# Patient Record
Sex: Female | Born: 1975 | Race: Black or African American | Hispanic: No | Marital: Married | Smoking: Never smoker
Health system: Southern US, Community
[De-identification: ages and names within clinical notes are randomized; demographics above are authoritative.]

## PROBLEM LIST (undated history)

## (undated) DIAGNOSIS — R7989 Other specified abnormal findings of blood chemistry: Secondary | ICD-10-CM

## (undated) DIAGNOSIS — T7840XA Allergy, unspecified, initial encounter: Secondary | ICD-10-CM

## (undated) DIAGNOSIS — M1710 Unilateral primary osteoarthritis, unspecified knee: Secondary | ICD-10-CM

## (undated) HISTORY — DX: Unilateral primary osteoarthritis, unspecified knee: M17.10

## (undated) HISTORY — DX: Allergy, unspecified, initial encounter: T78.40XA

## (undated) HISTORY — DX: Other specified abnormal findings of blood chemistry: R79.89

## (undated) HISTORY — PX: ABLATION: SHX5711

---

## 1999-02-08 ENCOUNTER — Other Ambulatory Visit: Admission: RE | Admit: 1999-02-08 | Discharge: 1999-02-08 | Payer: Self-pay | Admitting: Obstetrics and Gynecology

## 2000-01-10 ENCOUNTER — Emergency Department (HOSPITAL_COMMUNITY): Admission: EM | Admit: 2000-01-10 | Discharge: 2000-01-10 | Payer: Self-pay | Admitting: Emergency Medicine

## 2000-01-10 ENCOUNTER — Encounter: Payer: Self-pay | Admitting: Emergency Medicine

## 2000-02-27 ENCOUNTER — Other Ambulatory Visit: Admission: RE | Admit: 2000-02-27 | Discharge: 2000-02-27 | Payer: Self-pay | Admitting: Obstetrics and Gynecology

## 2001-09-22 ENCOUNTER — Other Ambulatory Visit: Admission: RE | Admit: 2001-09-22 | Discharge: 2001-09-22 | Payer: Self-pay | Admitting: Obstetrics and Gynecology

## 2003-03-31 ENCOUNTER — Other Ambulatory Visit: Admission: RE | Admit: 2003-03-31 | Discharge: 2003-03-31 | Payer: Self-pay | Admitting: Obstetrics and Gynecology

## 2004-01-01 ENCOUNTER — Other Ambulatory Visit: Admission: RE | Admit: 2004-01-01 | Discharge: 2004-01-01 | Payer: Self-pay | Admitting: Obstetrics and Gynecology

## 2004-06-24 ENCOUNTER — Emergency Department (HOSPITAL_COMMUNITY): Admission: EM | Admit: 2004-06-24 | Discharge: 2004-06-24 | Payer: Self-pay | Admitting: Emergency Medicine

## 2004-07-03 ENCOUNTER — Inpatient Hospital Stay (HOSPITAL_COMMUNITY): Admission: AD | Admit: 2004-07-03 | Discharge: 2004-07-03 | Payer: Self-pay | Admitting: Obstetrics and Gynecology

## 2004-07-11 ENCOUNTER — Inpatient Hospital Stay (HOSPITAL_COMMUNITY): Admission: AD | Admit: 2004-07-11 | Discharge: 2004-07-15 | Payer: Self-pay | Admitting: Obstetrics and Gynecology

## 2004-07-12 ENCOUNTER — Encounter (INDEPENDENT_AMBULATORY_CARE_PROVIDER_SITE_OTHER): Payer: Self-pay | Admitting: Specialist

## 2005-03-18 ENCOUNTER — Encounter: Admission: RE | Admit: 2005-03-18 | Discharge: 2005-04-25 | Payer: Self-pay | Admitting: Internal Medicine

## 2006-12-15 ENCOUNTER — Inpatient Hospital Stay (HOSPITAL_COMMUNITY): Admission: RE | Admit: 2006-12-15 | Discharge: 2006-12-15 | Payer: Self-pay | Admitting: Obstetrics and Gynecology

## 2007-02-11 ENCOUNTER — Inpatient Hospital Stay (HOSPITAL_COMMUNITY): Admission: AD | Admit: 2007-02-11 | Discharge: 2007-02-14 | Payer: Self-pay | Admitting: Obstetrics and Gynecology

## 2007-03-08 ENCOUNTER — Emergency Department (HOSPITAL_COMMUNITY): Admission: EM | Admit: 2007-03-08 | Discharge: 2007-03-08 | Payer: Self-pay | Admitting: Family Medicine

## 2008-09-04 ENCOUNTER — Ambulatory Visit (HOSPITAL_COMMUNITY): Admission: RE | Admit: 2008-09-04 | Discharge: 2008-09-04 | Payer: Self-pay | Admitting: Obstetrics and Gynecology

## 2008-11-10 ENCOUNTER — Ambulatory Visit (HOSPITAL_COMMUNITY): Admission: RE | Admit: 2008-11-10 | Discharge: 2008-11-10 | Payer: Self-pay | Admitting: Obstetrics and Gynecology

## 2008-11-10 ENCOUNTER — Encounter (INDEPENDENT_AMBULATORY_CARE_PROVIDER_SITE_OTHER): Payer: Self-pay | Admitting: Obstetrics and Gynecology

## 2011-04-15 NOTE — Op Note (Signed)
[name], [name]                 ACCOUNT NO.:  [id number]   MEDICAL RECORD NO.:  [id number]          PATIENT TYPE:  AMB   LOCATION:  SDC                           [facility]   PHYSICIAN:  Maxie Better, M.D.DATE OF BIRTH:  August 04, 1976   DATE OF PROCEDURE:  11/10/2008  DATE OF DISCHARGE:                               OPERATIVE REPORT   PREOPERATIVE DIAGNOSIS:  Menorrhagia.   PROCEDURE:  NovaSure ablation , diagnostic hysteroscope and dilation and  curettage.   POSTOPERATIVE DIAGNOSIS:  Menorrhagia.   ANESTHESIA:  MAC paracervical block.   SURGEON:  Maxie Better, MD   PROCEDURE:  Under adequate monitored anesthesia, the patient was placed  in a dorsal lithotomy position.  She was sterilely prepped and draped in  usual fashion.  Bladder was catheterized with small amount of urine.  Examination under anesthesia revealed a slightly anteverted uterus.  No  adnexal masses could be appreciated.  Bivalve speculum was placed into  the vagina.  A 10 mL of 1% Nesacaine was injected at 3 and 9 o'clock  position.  The anterior lip of the cervix was grasped with a single-  tooth tenaculum.  The uterus sounded to 10 cm and the endocervical canal  sounded to 5 cm.  The diagnostic hysteroscope was then introduced, both  tubal ostia were seen.  Small amount of healthy tissue was noted, but  otherwise unremarkable.  Hysteroscope was removed.  The NovaSure  apparatus was inserted.  The testing did not have the adequate  evaluation in the cavity.  The cavity length was decreased at 4.5 and  after adjusted to the angle of the uterus, the apparatus was ready for  the ablation.  The ablation was then performed with a cavity width of  3.5, power of 87, and a total time of 1 minute and 50 seconds.  The  procedure was then terminated.  The NovaSure apparatus removed.  The  hysteroscope inserted.  Good ablation noted.  The procedure was then  terminated by removing all instruments.   SPECIMEN:  Endometrial curetting.   ESTIMATED BLOOD LOSS:  Minimal.   COMPLICATIONS:  None.   FLUID DEFICIT:  Minimal.   The patient tolerated the procedure well and was transferred to recovery  in stable condition.     Maxie Better, M.D.  Electronically Signed    [place]/MEDQ  D:  11/10/2008  T:  11/11/2008  Job:  [id number]

## 2011-04-18 NOTE — Discharge Summary (Signed)
NAMECHELSIA, SERRES                             ACCOUNT NO.:  [id number]   MEDICAL RECORD NO.:  [id number]                   PATIENT TYPE:  INP   LOCATION:  9132                                 [facility]   PHYSICIAN:  Malachi Pro. Ambrose Mantle, M.D.              DATE OF BIRTH:  June 09, 1976   DATE OF ADMISSION:  07/11/2004  DATE OF DISCHARGE:                                 DISCHARGE SUMMARY   ADDENDUM TO DISCHARGE SUMMARY:   DISCHARGE MEDICATIONS:  1. Percocet 5/325 24 tablets 1 q.4-6h. p.r.n. pain.  2. Augmentin 500 mg 6 tablets 1 q.8h. x2 days.                                               Malachi Pro. Ambrose Mantle, M.D.    TFH/MEDQ  D:  07/15/2004  T:  07/15/2004  Job:  [id number]

## 2011-04-18 NOTE — Op Note (Signed)
Kellie [name], Kellie [name]                             ACCOUNT NO.:  [id number]   MEDICAL RECORD NO.:  [id number]                   PATIENT TYPE:  INP   LOCATION:  9172                                 [facility]   PHYSICIAN:  Malachi Pro. Ambrose Mantle, M.D.              DATE OF BIRTH:  08-Jun-1976   DATE OF PROCEDURE:  07/12/2004  DATE OF DISCHARGE:                                 OPERATIVE REPORT   PREOPERATIVE DIAGNOSES:  1. Intrauterine pregnancy at 40 weeks.  2. Pregnancy-induced high blood pressure.  3. Failure to progress in labor.   POSTOPERATIVE DIAGNOSES:  1. Intrauterine pregnancy at 40 weeks.  2. Pregnancy-induced high blood pressure.  3. Failure to progress in labor.   OPERATION:  Low transverse cervical cesarean section.   OPERATOR:  Malachi Pro. Ambrose Mantle, M.D.   ANESTHESIA:  Epidural.   PROCEDURE:  The patient was brought to the operating room and placed under  additional epidural anesthesia.  Fetal heart tones were confirmed.  The  patient's maternal heart rate had been 115-160.  The urine output was  diminished, about 150 cc over the previous 6 hours.  The abdomen was prepped  with Betadine solution, and draped as a sterile field.  A Foley catheter was  indwelling.  Anesthesia was confirmed.  A transverse incision was made and  carried in layers through the skin, subcutaneous tissue, and fascia.  The  fascia was then separated from the rectus muscle superiorly and inferiorly.  The rectus muscle was already split in the midline.  The peritoneum was  opened vertically.  The lower uterine segment was exposed.  An incision was  made into the lower uterine segment peritoneum, extended laterally, and the  bladder was pushed inferiorly.  An incision was then made into the lower  uterine segment with the knife, but  my finger was used to go into the  amniotic sac.  Clear fluid was obtained.  The incision was enlarged by  pulling superiorly and inferiorly on the uterine incision.  A loop  of cord  dropped out, and then the infant was delivered from the vertex position  without difficulty.  Nose and pharynx was suctioned with the bulb, cord was  clamped, and the infant was given to the neonatologist, Dr. Eric Form, who was  in attendance.  A segment of cord was preserved in case a pH was necessary.  Retained cord blood studies were obtained.  The placenta was removed intact.  The inside of the uterus was palpated and found to be free of any additional  fragments.  Both tubes and ovaries and the uterus appeared normal.  The  uterine incision was closed with 2 running sutures of 0 Vicryl, locking the  first layer, non-locking on the second layer.  Liberal irrigation confirmed  hemostasis.  Neither the parietal nor the visceral peritoneum were  reapproximated.  Hemostasis was found to be adequate, and  the rectus muscle  was reapproximated with interrupted 0 Vicryl.  The fascia was closed with 2  running sutures of 0  Vicryl, subcutaneous with a running 3-0 Vicryl, and the skin was closed with  automatic staples.  The patient seemed to tolerate the procedure well.  Blood loss was about 1000 cc.  Sponge and needle counts were correct, and  the patient was returned to recovery in satisfactory condition.                                               Malachi Pro. Ambrose Mantle, M.D.    TFH/MEDQ  D:  07/12/2004  T:  07/12/2004  Job:  [id number]

## 2011-04-18 NOTE — Discharge Summary (Signed)
Kellie [name], Kellie [name]                             ACCOUNT NO.:  [id number]   MEDICAL RECORD NO.:  [id number]                   PATIENT TYPE:  INP   LOCATION:  9132                                 [facility]   PHYSICIAN:  Malachi Pro. Ambrose Mantle, M.D.              DATE OF BIRTH:  January 30, 1976   DATE OF ADMISSION:  07/11/2004  DATE OF DISCHARGE:  07/15/2004                                 DISCHARGE SUMMARY   A 35 year old, black, married female, para 0, gravida 1, EDC July 11, 2004, by ultrasound, admitted with contractions and elevated blood pressure.  The patient's prenatal laboratories are noted in her history and physical.  After admission to the hospital, the patient was given Cervidil to see if it  would ripen her cervix.  The patient's cervix had remained closed throughout  the latter part of the prenatal course.  By 6:30 a.m. on July 12, 2004,  the cervix was 4 cm dilated, but in spite of optimal doses of Pitocin and  good contractions, the patient never progressed beyond 5-6 cm.  At  approximately 6 p.m., she was taken to the operating room and underwent a  low transverse cervical C-section by Dr. Ambrose Mantle under epidural anesthesia  with delivery of an 8 pound 0 ounce infant with Apgars of 7 at one minute  and 9 at five minutes.  Blood loss was about 1000 mL.  During labor, the  patient's heart rate ranged from 115 to the 160s.  After delivery, W.  Viann Fish, M.D., was consulted.  He advised getting a TSH and if she  continued to have problems with palpitations, then she should come by his  office for 30-day event monitor to make sure she was not having any  arrhythmias.  The patient did have a temperature elevation to 100.4 degrees  soon after delivery and then on evening of the first postoperative day she  had a fever to 100.7 degrees.  She was placed on Unasyn.  She did extremely  clinically.  She passed flatus, had a bowel movement, tolerated regular  diet, voided well  without difficulty and ambulated well without any  problems.  After beginning the Unasyn, she became afebrile and has remained  afebrile for 32 hours prior to discharge.  Staples have been removed and  strips applied.  The TSH was 1.901, which is well within the normal limits.  The initial hemoglobin was 12.5.  Followup hemoglobins were 11, 9, and 10.2.  Platelet count 301,000.  White count 6600.  The comprehensive metabolic  panel was basically normal.  There was one low sodium at 133 and a glucose  of 121.  Albumins were low and alkaline phosphatase was high, consistent  with pregnancy.  Urinalysis showed 11-20 red cells.  Urine culture is  pending.  RPR was nonreactive.   FINAL DIAGNOSIS:  Intrauterine pregnancy at 40 weeks delivered by C-section  with failure to progress in labor and possible chorioamnionitis and  endometritis.   OPERATION:  Low transverse cervical cesarean section.   FINAL CONDITION:  Improved.   INSTRUCTIONS:  Our regular discharge instruction booklet.  The patient is  advised to make an appointment to see [place] in 10-14 days for followup  examination.                                               Malachi Pro. Ambrose Mantle, M.D.    TFH/MEDQ  D:  07/15/2004  T:  07/15/2004  Job:  [id number]

## 2011-04-18 NOTE — Op Note (Signed)
Kellie [name], Kellie [name]                 ACCOUNT NO.:  [id number]   MEDICAL RECORD NO.:  [id number]          PATIENT TYPE:  INP   LOCATION:  9130                          [facility]   PHYSICIAN:  Maxie Better, M.D.DATE OF BIRTH:  October 18, 1976   DATE OF PROCEDURE:  02/11/2007  DATE OF DISCHARGE:                               OPERATIVE REPORT   PREOPERATIVE DIAGNOSES:  1. Previous cesarean section.  2. Term gestation.   PROCEDURE:  1. Repeat cesarean section.  2. Lysis of adhesions.   POSTOPERATIVE DIAGNOSES:  1. Previous cesarean section.  2. Term gestation.  3. Abdominal pelvic adhesions.   ANESTHESIA:  Spinal.   SURGEON:  Maxie Better, M.D.   ASSISTANT:  Marlinda Mike, C.N.M.   PROCEDURE:  After multiple attempts by the anesthesia team, over a  period of approximately an hour, spinal anesthesia was obtained.  The  patient was subsequently placed in the supine position.  She was  sterilely prepped and draped in the usual fashion.  Indwelling Foley  catheter was sterilely placed.  Adequate anesthesia level was noted.  Marcaine 0.25% was then injected along the previous Pfannenstiel skin  incision.  Pfannenstiel skin incision was then made and carried down to  the rectus fascia.  The rectus fascia was opened transversely.  The  rectus fascia was then bluntly and sharply dissected off the rectus  muscle in superior and inferior fashion.  The rectus muscles were  sharply split in the midline.  The parietoperitoneum was at that point  entered.  Careful dissection was then performed, and the  parietoperitoneum was then extended superiorly and inferiorly.  On  entering the abdominal cavity, it was noted that the omentum was  partially adhered to the anterior abdominal wall; this was lysed.  It  was also noted that the right anterior aspect of the serosa of the  uterus was approximately 2 inches in length was also adherent to the  right anterior abdominal wal. This   limited the ability to deliver  without further taken down the adhesions.  Careful dissection was then  performed using cautery and sharp dissection with resultant separation  of the serosal surface of the uterus off the anterior abdominal wall.  Attention was then turned to the lower uterine segment.  A transverse  incision was then attempted to develop the bladder flap.  It was already  noted that the bladder was very adherent to the lower uterine segment.  Concern for damage resulted in no further attempts at sharp dissection.  A curvilinear incision was then made above what appeared to be the  bladder reflection and extended bilaterally.  Artificial rupture of  membranes was performed.  Clear fluid was noted.  The vertex was  floating.  At that point a vacuum was applied to the vertex for  stabilization and delivery of a live female.  This was subsequently  accomplished from the occiput anterior position.  The baby was bulb  suctioned at the abdomen.  The cord was clamped and cut.  The baby was  transferred to the awaiting pediatricians, who assigned Apgars of  8 and  9 at 1 and 5 minutes.  The placenta was spontaneous intact; not sent to  pathology.  Uterine cavity was cleaned of debris.  Uterine incision had  no extension.  Uterine incision was closed with 0  Monocryl running lock  stitch at first layer; second layer  was also imbricated using 0  Monocryl suture.  An additional bleeder  on the right was hemostased  with a figure-of-eight suture.  The bladder area was inspected and was  well away from the incisions.  Normal tubes and ovaries were noted  bilaterally.   The patient was then turned back to the defect made from the separation  of the uterine adhesions to the anterior abdominal wall.  This defect  was then closed with 0 Vicryl running stitch.  Additional bleeders were  cauterized on the uterus in that area.  Inspection of the omentum showed  that there was freely mobile,  and normal tubes and ovaries were noted  bilaterally.  The abdomen was then copiously irrigated and suctioned of  debris.  Intercede  was placed overlying the repaired defect in uterus,  which was related to the adhesions.  The parietoperitoneum was then  closed with 2-0 Vicryl suture.  The rectus fascia was closed with 0  Vicryl x2.  The subcutaneous area was closed with interrupted 2-0 plain  sutures, and the skin approximated using Ethicon staples.   SPECIMEN:  Placenta not sent to pathology.   ESTIMATED BLOOD LOSS:  500 mL.   URINE OUTPUT:  200 mL clear yellow urine.   INTRAOPERATIVE FLUID:  3 liters.   FETAL WEIGHT:  7 pounds.   COMPLICATIONS:  None.   COUNTS:  Sponge and instrument counts x2 were correct.   DISPOSITION:  The patient tolerated the procedure well; was transferred  to the recovery room in stable condition.      Maxie Better, M.D.  Electronically Signed     [place]/MEDQ  D:  02/11/2007  T:  02/11/2007  Job:  [id number]

## 2011-04-18 NOTE — Discharge Summary (Signed)
Kellie [name], Kellie [name]                 ACCOUNT NO.:  [id number]   MEDICAL RECORD NO.:  [id number]          PATIENT TYPE:  INP   LOCATION:  9130                          [facility]   PHYSICIAN:  Maxie Better, M.D.DATE OF BIRTH:  [birth year]   DATE OF ADMISSION:  02/11/2007  DATE OF DISCHARGE:  02/14/2007                               DISCHARGE SUMMARY   ADMISSION DIAGNOSIS:  Term gestation, previous cesarean section.   DISCHARGE DIAGNOSIS:  Term gestation delivered, previous cesarean  section, abdominal pelvic adhesions.   PROCEDURE:  Repeat cesarean section, lysis of adhesions.   HISTORY OF PRESENT ILLNESS:  This is a 35 year old gravida 2, para 1  female at term with a previous cesarean section who presents for  elective repeat C-section.   HOSPITAL COURSE:  The patient was admitted to [facility].  She was  taken to the operating room.  She underwent a repeat cesarean section.  Abdominal pelvic adhesions were encountered at time of her surgery.  Please see the dictated operative report for the specific details.  The  baby was a 7 pounds female, Apgars of 8 and 9.  Normal tubes and ovaries.  The right uterine wall was attached to the anterior wall and that was  lysed.  Her postoperative course was notable for transient elevation of  her blood pressure was PIH labs that were normal.  Her CBC on postop day  #1 showed a hemoglobin of 10.2.  Her preop hemoglobin was 11.3.  Her  hematocrit was 29.8, platelet count of 20,000, white count of 7.8.  By  postop day #3, her blood pressures were 142-155 over 94-99.  The patient  had no PIH warning signs and exam was unremarkable.  She was deemed well  to be discharged home.   DISPOSITION:  Home.  Condition stable.   DISCHARGE MEDICATIONS:  Tylox 1-2 tablets every three to four hours  p.r.n. pain and Motrin 800 mg one p.o. q.8h p.r.n. pain.  Continue  prenatal vitamins one p.o. daily   FOLLOW-UP APPOINTMENTS:  [facility]   six weeks postpartum.  Discharge instructions with the postpartum booklet given.      Maxie Better, M.D.  Electronically Signed     [place]/MEDQ  D:  03/28/2007  T:  03/28/2007  Job:  [id number]

## 2011-04-18 NOTE — H&P (Signed)
Kellie [name], Kellie [name]                             ACCOUNT NO.:  [id number]   MEDICAL RECORD NO.:  [id number]                   PATIENT TYPE:  INP   LOCATION:  9172                                 [facility]   PHYSICIAN:  Malachi Pro. Ambrose Mantle, M.D.              DATE OF BIRTH:  [birth year]   DATE OF ADMISSION:  07/11/2004  DATE OF DISCHARGE:                                HISTORY & PHYSICAL   HISTORY OF PRESENT ILLNESS:  The patient is a 35 year old black married  female, para 0, gravida 1, [facility] July 11, 2004 by ultrasound, admitted with  contractions and elevated blood pressure.  Blood group and type O positive,  negative antibody, sickle cell negative, RPR nonreactive, rubella immune,  hepatitis B surface antigen negative, HIV negative, GC and Chlamydia  negative, triple screen borderline elevated, AFP one hour Glucola 47, group  B strep positive.  Vaginal ultrasound on December 14, 2003 revealed crown-  rump length 3.13 cm, 10 weeks, 0 days, [facility] July 11, 2004.  Vanishing twin  was seen.  Alpha-fetoprotein was 2.47 multiples of the median.  Ultrasound  showed no abnormalities.  Ultrasound on February 14, 2004 showed an average  gestational age of [redacted] weeks, 0 days, [facility] July 10, 2004.  On July 03, 2004, blood pressure was 146/92.  PIH labs were normal.  Non-stress tests  have been reactive.  At approximately 8 p.m. on July 10, 2004, the patient  began noting stronger contractions.  She came to the maternity admission  unit.  Blood pressure was 143/94 to 161/102.  PIH labs were normal.  No  headache or epigastric pain.  Contractions were every 2-6 minutes.  Cervix  was long and closed, as it had been the patient told the nurse he is not  going to send me home hurting like this.   PAST MEDICAL HISTORY:  No known allergies.  No operations or illnesses; none  of significance.   FAMILY HISTORY:  Paternal grandmother with high blood pressure, heart  disease, and diabetes.  Mother with  lupus; also had a nephrectomy.  Paternal  grandfather with prostate cancer.   SOCIAL HISTORY:  Alcohol, tobacco, and drugs - none.   PHYSICAL EXAMINATION ON ADMISSION:  VITAL SIGNS:  Blood pressure as stated  in the present illness.  Temperature 98, pulse 103.  HEART:  Normal sinus tachycardia with no murmurs.  LUNGS:  Clear to percussion and auscultation.  ABDOMEN:  Soft.  Fundal height 40 cm on July 08, 2004.  Fetal heart tones  were normal.  There was good reactivity, and only one deceleration.  PELVIC:  The cervix was long and closed.  Vertex at a -4.  Deep tendon  reflexes were 1+.   The patient was admitted.  PIH labs were ordered, and were normal.  At 5:50  p.m. on July 11, 2004, the patient started she had had severe pain  with  contractions all day long with Cervidil in place.  She took Ambien with  intermittent sleeping.  Contractions were every 2-3 minutes with the  Cervidil since about 10:30 a.m.  The cervix was a dimple, 70%, vertex at a -  3.  At 6:30 a.m. on July 12, 2004, the patient reported being extremely  uncomfortable overnight, requested elective cesarean section at 3:30 a.m.  secondary to pain.  At 6:30 a.m., the cervix was 4 cm, 80%.  Artificial  rupture of membranes produced clear fluid.  By 12:15 p.m., the Pitocin was  at 10 milliunits a minute, contractions every 3 minutes, cervix 4-5 cm.  At  1:30 p.m., the cervix was thought to be 6 cm, but by 5 p.m. the Pitocin was  at 18 milliunits a minute, contractions were every 3 minutes.  The cervix  was probably only 5-6 cm, 80%.  I proceeded to cesarean section for failure  to progress in labor.  The maternal heart rate was 115-160.  EKG showed  sinus tachycardia with septal infarction, age undetermined.  I questioned  the significance of this.  I think it may have to do with lead placement.  The patient has had no chest pain.   IMPRESSIONS:  1. Intrauterine pregnancy at 40 weeks.  2. Failure to progress in  labor.  3. Maternal tachycardia.   The patient is proceeding to cesarean section.                                               Malachi Pro. Ambrose Mantle, M.D.    TFH/MEDQ  D:  07/12/2004  T:  07/12/2004  Job:  [id number]

## 2011-04-18 NOTE — Consult Note (Signed)
NAMEABAGAEL, KRAMM NO.:  [id number]   MEDICAL RECORD NO.:  [id number]                   PATIENT TYPE:  INP   LOCATION:  9132                                 FACILITY:  [facility]   PHYSICIAN:  W. Ashley Royalty., M.D.         DATE OF BIRTH:  18-Sep-1976   DATE OF CONSULTATION:  07/12/2004  DATE OF DISCHARGE:                                   CONSULTATION   I was asked to see this 35 year old black female by Dr. Ambrose Mantle for  evaluation of tachycardia and a possibly abnormal EKG.  The patient is now  post cesarean section for her first child. She had a history of pregnancy  induced hypertension. She has a previous history of intermittent  palpitations that have been present over several years but have not been  terribly symptomatic. She does describe it as a rapid onset of palpitations  that will last for variable periods of time and don't have any specific  aggravating or relieving factors. 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 She was taken to the  emergency room and was evaluated there. At the time, she had a potassium of  3.5 and was slightly anemic and she reportedly had a pulse of 130.  I do not  have an EKG to review from there.  Following that, she came to the hospital  last evening for labor and was given prostaglandin as well as Pitocin but  has had increased pain and came to a cesarean section this evening.  She  reportedly had increased pulse rate and tachycardia but none of this is  documented on telemetry.  She was monitored during the cesarean section and  was noted to have a fairly persistent tachycardia as well as some mild  hypotension when she received the epidural.  Maternal heart rate was noted  to be anywhere from 115 to 160 on the Pitocin.  She also had pregnancy  induced hypertension previously.  She did  not have any evidence of  eclampsia.  Following delivery, she has been somewhat in pain and has  received some morphine. She is otherwise felt fine and has no significant  chest pain. Evidently an EKG was obtained in the postoperative period that  was interpreted as an anterior MI and I was asked to see her.  She has  absolutely no complaint of chest pain. She does state that she is unable to  complete aerobics when she does them. Her past history if remarkable for  pregnancy induced hypertension and mild obesity. Previous surgery, removal  of wisdom tooth and cesarean section.   ALLERGIES:  None.   FAMILY HISTORY:  Father is HIV positive, mother has lupus. She has two  sisters and a brother who are healthy.  No premature heart  disease in the  family.   SOCIAL HISTORY:  She is a native of [place], attended [employer].  She is an  Youth worker at [employer], her husband works in the  echo lab there. She does not smoke, drinks alcohol socially.   REVIEW OF SYMPTOMS:  Otherwise unremarkable.   PHYSICAL EXAMINATION:  GENERAL:  She is a pleasant somewhat lethargic black  female who is moderately obese.  VITAL SIGNS:  Blood pressure is currently 150/85, pulse is currently 112.  LUNGS:  Clear.  CARDIAC:  Normal.  S1 and S2, no S3 and no murmur.  ABDOMEN:  Soft, abdomen is postop, scar was not examined. There is 1 to 2+  peripheral edema noted.   The 12 lead EKG as reviewed by me shows sinus tachycardia with normal T  waves. There is a poor R wave progression in V1 and V2 but there is no  evidence of myocardial infarction or acute abnormality and I would attribute  this to lead placements.  I would consider the EKG normal for her age and  body habitus.   IMPRESSION:  1. The EKG, I believe, is normal and does not need further workup.  2. Episodic tachycardia that may have been related to the pain of labor     possibly Pitocin, anemia and other factors of pregnancy.  3.  Intermittent palpitations in the past which could have represented PAT-     emergency room visit several weeks ago.   RECOMMENDATIONS:  She may go to the regular floor, does not need telemetry  monitoring. Will check a TSH on her. May continue normal recovery following  delivery. If she fails to normalize following this, she might need to have  an echocardiogram and if she continues to complain of palpitations, I would  recommend an outpatient cardiac event monitor to rule out PAT or other  cardiac arrhythmias.  I gave her my card and I appreciate seeing this nice  woman with you.                                               Darden Palmer., M.D.    WST/MEDQ  D:  07/12/2004  T:  07/12/2004  Job:  [id number]   cc:   Malachi Pro. Ambrose Mantle, M.D.  [address]  [place] [address]  Fax: [phone]   Neta Mends. Fabian Sharp, M.D. [facility]

## 2011-07-23 ENCOUNTER — Other Ambulatory Visit: Payer: Self-pay | Admitting: Obstetrics and Gynecology

## 2011-07-23 DIAGNOSIS — Z1231 Encounter for screening mammogram for malignant neoplasm of breast: Secondary | ICD-10-CM

## 2011-07-31 ENCOUNTER — Ambulatory Visit
Admission: RE | Admit: 2011-07-31 | Discharge: 2011-07-31 | Disposition: A | Discharge status: Home / Self Care | Payer: 59 | Source: Ambulatory Visit | Attending: Obstetrics and Gynecology | Admitting: Obstetrics and Gynecology

## 2011-07-31 DIAGNOSIS — Z1231 Encounter for screening mammogram for malignant neoplasm of breast: Secondary | ICD-10-CM

## 2011-08-08 ENCOUNTER — Ambulatory Visit (INDEPENDENT_AMBULATORY_CARE_PROVIDER_SITE_OTHER): Payer: 59 | Admitting: Family Medicine

## 2011-08-08 ENCOUNTER — Encounter: Payer: Self-pay | Admitting: Family Medicine

## 2011-08-08 DIAGNOSIS — G4730 Sleep apnea, unspecified: Secondary | ICD-10-CM

## 2011-08-08 DIAGNOSIS — D649 Anemia, unspecified: Secondary | ICD-10-CM

## 2011-08-08 DIAGNOSIS — G479 Sleep disorder, unspecified: Secondary | ICD-10-CM | POA: Insufficient documentation

## 2011-08-08 LAB — FERRITIN: Ferritin: 49 ng/mL (ref 10–291)

## 2011-08-08 LAB — CBC
Hemoglobin: 11.3 g/dL — ABNORMAL LOW (ref 12.0–15.0)
Platelets: 304 10*3/uL (ref 150–400)
RBC: 3.9 MIL/uL (ref 3.87–5.11)
WBC: 4.7 10*3/uL (ref 4.0–10.5)

## 2011-08-08 NOTE — Progress Notes (Signed)
  Subjective:    Patient ID: Kellie [name], female    DOB: [birth year], 35 y.o.   MRN: [id number]  HPI  Sees Ob/gyn, Dr. Cherly Hensen, who referred because low hgb without obvious cause.  Up to date on Pap smears. Mammograms, etc. Did take iron while pregnant.  No baseline labs to review.  Now scant menses.  Diet normal   Call cell phone with lab results    Review of Systems  Frequent HAs   New over past one month.  Seems to wake in morning with headaches.  Loud snorer.  Spouse says occaisionally stops breathing.  Never evaluated for sleep apnea.     Objective:   Physical Exam generous thyroid on exam (recent TSH reportedly normal) Lungs clear Cardiac RRR without m or g Abd benign.        Assessment & Plan:

## 2011-08-08 NOTE — Assessment & Plan Note (Signed)
Anemic by report.  Labs not available.  Will start WU with CBC and ferritin.  Further WU based on initial tests.

## 2011-08-11 ENCOUNTER — Encounter: Payer: Self-pay | Admitting: Family Medicine

## 2011-08-11 ENCOUNTER — Telehealth: Payer: Self-pay | Admitting: Family Medicine

## 2011-08-11 DIAGNOSIS — D649 Anemia, unspecified: Secondary | ICD-10-CM

## 2011-08-11 NOTE — Assessment & Plan Note (Addendum)
Initial labs show mild, normochromic, normocytic anemia.  Normal ferritin so not iron deficient.  Will check Hgb electrophoresis for thalasemia minor.  Called patient and LM.  Also sent letter.

## 2011-08-11 NOTE — Telephone Encounter (Signed)
See anemia problem

## 2011-08-13 ENCOUNTER — Ambulatory Visit: Payer: Self-pay | Admitting: Family Medicine

## 2011-08-13 ENCOUNTER — Encounter: Payer: Self-pay | Admitting: Family Medicine

## 2011-08-13 DIAGNOSIS — D649 Anemia, unspecified: Secondary | ICD-10-CM

## 2011-08-13 NOTE — Progress Notes (Signed)
  Subjective:    Patient ID: Kellie [name], female    DOB: August 16, 1976, 35 y.o.   MRN: [id number]  HPI Entered outside labs in anemia overview.      Review of Systems     Objective:   Physical Exam        Assessment & Plan:

## 2011-08-14 ENCOUNTER — Other Ambulatory Visit: Payer: 59

## 2011-08-14 DIAGNOSIS — D649 Anemia, unspecified: Secondary | ICD-10-CM

## 2011-08-14 NOTE — Progress Notes (Signed)
Lab drawn today [facility]

## 2011-08-18 LAB — HEMOGLOBINOPATHY EVALUATION
Hgb A: 97.7 % (ref 96.8–97.8)
Hgb F Quant: 0 % (ref 0.0–2.0)
Hgb S Quant: 0 % (ref 0.0–0.0)

## 2011-08-19 ENCOUNTER — Encounter: Payer: Self-pay | Admitting: Family Medicine

## 2011-08-31 ENCOUNTER — Ambulatory Visit (HOSPITAL_BASED_OUTPATIENT_CLINIC_OR_DEPARTMENT_OTHER): Payer: 59 | Attending: Family Medicine

## 2011-08-31 DIAGNOSIS — G4710 Hypersomnia, unspecified: Secondary | ICD-10-CM | POA: Insufficient documentation

## 2011-08-31 DIAGNOSIS — R0989 Other specified symptoms and signs involving the circulatory and respiratory systems: Secondary | ICD-10-CM | POA: Insufficient documentation

## 2011-08-31 DIAGNOSIS — R0609 Other forms of dyspnea: Secondary | ICD-10-CM | POA: Insufficient documentation

## 2011-09-05 LAB — CBC
MCHC: 33.7 g/dL (ref 30.0–36.0)
MCV: 89.9 fL (ref 78.0–100.0)
RBC: 3.63 MIL/uL — ABNORMAL LOW (ref 3.87–5.11)
RDW: 13.8 % (ref 11.5–15.5)

## 2011-09-05 LAB — PREGNANCY, URINE: Preg Test, Ur: NEGATIVE

## 2011-09-06 DIAGNOSIS — R0989 Other specified symptoms and signs involving the circulatory and respiratory systems: Secondary | ICD-10-CM

## 2011-09-06 DIAGNOSIS — G4710 Hypersomnia, unspecified: Secondary | ICD-10-CM

## 2011-09-06 DIAGNOSIS — G4730 Sleep apnea, unspecified: Secondary | ICD-10-CM

## 2011-09-06 DIAGNOSIS — R0609 Other forms of dyspnea: Secondary | ICD-10-CM

## 2011-09-06 NOTE — Procedures (Signed)
Kellie [name], Kellie [name]                 ACCOUNT NO.:  [id number]  MEDICAL RECORD NO.:  [id number]          PATIENT TYPE:  OUT  LOCATION:  SLEEP CENTER                 FACILITY:  [facility]  PHYSICIAN:  AmeLie Hollars D. Maple Hudson, MD, FCCP, FACPDATE OF BIRTH:  05/04/1975  DATE OF STUDY:  08/31/2011                           NOCTURNAL POLYSOMNOGRAM  REFERRING PHYSICIAN:  Chrissie Noa A. Leveda Anna, M.D.  REFERRING PHYSICIAN:  Santiago Bumpers. Hensel, MD  INDICATION FOR STUDY:  Hypersomnia with sleep apnea.  Epworth sleepiness score 22/24, BMI 36.  Weight 237 pounds, height 68 inches.  Neck 16.5 inches.  Home medications are charted as "none".  SLEEP ARCHITECTURE:  Total sleep time 335.5 minutes with sleep efficiency 92.3%.  Stage I was 6.4%, stage II 67.4%, stage III 4.2%, REM 22.1% of total sleep time.  Sleep latency 22 minutes, REM latency 133.5 minutes, awake after sleep onset 6.5 minutes, arousal index 3.6.  RESPIRATORY DATA:  Apnea/hypopnea index (AHI) 3.2 per hour.  A total of 18 events were scored including 9 obstructive apneas and 9 hypopneas. Events were more common while supine and in REM.  REM AHI 10.5 per hour. There were insufficient numbers of events to permit application of CPAP titration by split protocol on the study night.  RESPIRATORY DATA:  Moderate snoring with oxygen desaturation to a nadir of 89% and a mean oxygen saturation through the study of 96.6% on room air.  CARDIAC DATA:  Normal sinus rhythm.  MOVEMENT/PARASOMNIA:  No significant movement disturbance.  No bathroom trips.  IMPRESSION/RECOMMENDATIONS: 1. Occasional respiratory events with sleep disturbance, within normal     limits. AHI 3.2/hr (Normal adult range is from 0-5/hr). Epworth sleepiness score of 22/24 would be consistent with increased daytime sleepiness.  The current sleep study does not seem abnormal enough to explain this on a long-term basis.  If this study is representative of sleep in the home environment, then  question if there is additional reason for daytime sleepiness including intervals of inadequate sleep or a primary disorder of hypersomnolence such as narcolepsy or idiopathic.  If appropriate, the [facility] can be contacted to arrange a multiple sleep latency test for evaluation of primary hypersomnolence. 2.Moderate snoring with oxygen desaturation to a nadir of 89% and a mean saturation through the study of 96.6% on room air.     Hiroshi Krummel D. Maple Hudson, MD, [facility], FACP Diplomate, Biomedical engineer of Sleep Medicine Electronically Signed    CDY/MEDQ  D:  09/06/2011 10:09:09  T:  09/06/2011 10:24:56  Job:  [id number]

## 2011-09-19 ENCOUNTER — Encounter: Payer: Self-pay | Admitting: Family Medicine

## 2011-09-19 DIAGNOSIS — G479 Sleep disorder, unspecified: Secondary | ICD-10-CM

## 2011-09-19 NOTE — Progress Notes (Signed)
  Subjective:    Patient ID: Kellie [name], female    DOB: 09/06/1976, 35 y.o.   MRN: [id number]  HPI Results of sleep study documented in problem overview    Review of Systems     Objective:   Physical Exam        Assessment & Plan:

## 2011-12-12 ENCOUNTER — Ambulatory Visit (INDEPENDENT_AMBULATORY_CARE_PROVIDER_SITE_OTHER): Payer: 59 | Admitting: Family Medicine

## 2011-12-12 ENCOUNTER — Encounter: Payer: Self-pay | Admitting: Family Medicine

## 2011-12-12 VITALS — BP 149/93 | HR 101 | Temp 98.7°F | Ht 68.0 in | Wt 241.2 lb

## 2011-12-12 DIAGNOSIS — F419 Anxiety disorder, unspecified: Secondary | ICD-10-CM

## 2011-12-12 DIAGNOSIS — F329 Major depressive disorder, single episode, unspecified: Secondary | ICD-10-CM | POA: Insufficient documentation

## 2011-12-12 DIAGNOSIS — F341 Dysthymic disorder: Secondary | ICD-10-CM

## 2011-12-12 MED ORDER — CITALOPRAM HYDROBROMIDE 20 MG PO TABS: ORAL_TABLET | ORAL | Status: DC

## 2011-12-12 MED ORDER — VITAMIN D (ERGOCALCIFEROL) 1.25 MG (50000 UNIT) PO CAPS: 50000.0000 [IU] | ORAL_CAPSULE | ORAL | Status: DC

## 2011-12-12 NOTE — Patient Instructions (Addendum)
Celexa (citalopram) 20 mg daily for 1 week, then 40 mg daily  Make follow-up in 2-3 weeks   If you feel further counseling for stress coping would be helpful, please contact Dr. Pascal Lux, clinical psychologist, to set up an appointment.  She can be reached at [phone].      Anxiety and Panic Attacks Your caregiver has informed you that you are having an anxiety or panic attack. There may be many forms of this. Most of the time these attacks come suddenly and without warning. They come at any time of day, including periods of sleep, and at any time of life. They may be strong and unexplained. Although panic attacks are very scary, they are physically harmless. Sometimes the cause of your anxiety is not known. Anxiety is a protective mechanism of the body in its fight or flight mechanism. Most of these perceived danger situations are actually nonphysical situations (such as anxiety over losing a job). CAUSES   The causes of an anxiety or panic attack are many. Panic attacks may occur in otherwise healthy people given a certain set of circumstances. There may be a genetic cause for panic attacks. Some medications may also have anxiety as a side effect. SYMPTOMS   Some of the most common feelings are:  Intense terror.     Dizziness, feeling faint.     Hot and cold flashes.     Fear of going crazy.     Feelings that nothing is real.     Sweating.    Shaking.    Chest pain or a fast heartbeat (palpitations).     Smothering, choking sensations.     Feelings of impending doom and that death is near.     Tingling of extremities, this may be from over-breathing.     Altered reality (derealization).     Being detached from yourself (depersonalization).  Several symptoms can be present to make up anxiety or panic attacks. DIAGNOSIS   The evaluation by your caregiver will depend on the type of symptoms you are experiencing. The diagnosis of anxiety or panic attack is made when no physical  illness can be determined to be a cause of the symptoms. TREATMENT   Treatment to prevent anxiety and panic attacks may include:  Avoidance of circumstances that cause anxiety.     Reassurance and relaxation.     Regular exercise.     Relaxation therapies, such as yoga.     Psychotherapy with a psychiatrist or therapist.     Avoidance of caffeine, alcohol and illegal drugs.     Prescribed medication.  SEEK IMMEDIATE MEDICAL CARE IF:    You experience panic attack symptoms that are different than your usual symptoms.     You have any worsening or concerning symptoms.  Document Released: 11/17/2005 Document Revised: 07/30/2011 Document Reviewed: 03/21/2010 [facility] Patient Information 2012 [place], [place].

## 2011-12-12 NOTE — Progress Notes (Signed)
  Subjective:    Patient ID: Kellie [name], female    DOB: [birth year], 36 y.o.   MRN: [id number]  HPIWork in appt for evaluation of anxiety and depression.  First diagnosed during pregnancy 4 years ago.  Was placed on a medication (unsure of name) patient self discontinued shortly after because she felt it made her too numb but does feel like it was effective.  Since then has been doing relatively well until 6 months ago.  States job has become more stressful as changes are starting to take place.  Symptoms are mostly anxiety- noting chest heaviness" heart is about to jump out of chest" and feeling of wanting to be alone.  She is able to control episodes with calming and deep breathing.  About once a month has a period of a few days where she does not want to be bothered and secludes herself.  Sometimes episodes are triggered if she has a confrontation coming up.   Overall has been worsening for the past 6 months  Lives with husband and 35 and 62 year old.  Husband is very supportive.  No SI, HI.    I have reviewed patient's  PMH, FH, and Social history and Medications as related to this visit.  Patient requests fill of vitamin D supplementation initially prescribed by OBGYN- never completed course and no longer has Rx- has a follow-up lab draw scheduled with OB.  Review of SystemsNo dyspnea, fever, chills.     Objective:   Physical Exam  Psychiatric: She has a normal mood and affect. Her speech is normal and behavior is normal. Judgment and thought content normal. Cognition and memory are normal. She expresses no suicidal plans and no homicidal plans.   GEN: Alert & Oriented, No acute distress Neck: supple without thyromegaly. CV:  Regular Rate & Rhythm, no murmur Respiratory:  Normal work of breathing, CTAB   PHQ-9: 7 , somewhat difficult MDQ: 0 GAD-7: 9, somewhat difficult     Assessment & Plan:

## 2011-12-12 NOTE — Assessment & Plan Note (Signed)
Interview and Questionnaires indicate mild anxiety and depression.  Discussed options of medication, therapy, or ideally both.  She would like to start medication  and will think about therapy.  I strongly encouraged therapy as an important adjunctive tool to coping with stress and i think she would benefit from some CBT and biofeedback to help with anxiety producing situations.  Also discuss adjunctive measures such as daily exercise.  Will start Celexa 20 mg daily, titrate up to 40 mg in 1-2 weeks.  Will return in 2-3 weeks for follow-up

## 2012-01-02 ENCOUNTER — Ambulatory Visit: Payer: 59 | Admitting: Family Medicine

## 2012-03-04 ENCOUNTER — Ambulatory Visit (HOSPITAL_COMMUNITY)
Admission: RE | Admit: 2012-03-04 | Discharge: 2012-03-04 | Disposition: A | Discharge status: Home / Self Care | Payer: 59 | Source: Ambulatory Visit | Attending: Family Medicine | Admitting: Family Medicine

## 2012-03-04 ENCOUNTER — Other Ambulatory Visit (HOSPITAL_COMMUNITY)
Admission: RE | Admit: 2012-03-04 | Discharge: 2012-03-04 | Disposition: A | Discharge status: Home / Self Care | Payer: 59 | Source: Ambulatory Visit | Attending: Family Medicine | Admitting: Family Medicine

## 2012-03-04 ENCOUNTER — Ambulatory Visit (INDEPENDENT_AMBULATORY_CARE_PROVIDER_SITE_OTHER): Payer: 59 | Admitting: Family Medicine

## 2012-03-04 ENCOUNTER — Encounter: Payer: Self-pay | Admitting: Family Medicine

## 2012-03-04 DIAGNOSIS — R109 Unspecified abdominal pain: Secondary | ICD-10-CM | POA: Insufficient documentation

## 2012-03-04 DIAGNOSIS — N760 Acute vaginitis: Secondary | ICD-10-CM

## 2012-03-04 DIAGNOSIS — R197 Diarrhea, unspecified: Secondary | ICD-10-CM | POA: Insufficient documentation

## 2012-03-04 DIAGNOSIS — B9689 Other specified bacterial agents as the cause of diseases classified elsewhere: Secondary | ICD-10-CM | POA: Insufficient documentation

## 2012-03-04 DIAGNOSIS — A499 Bacterial infection, unspecified: Secondary | ICD-10-CM

## 2012-03-04 DIAGNOSIS — Z113 Encounter for screening for infections with a predominantly sexual mode of transmission: Secondary | ICD-10-CM | POA: Insufficient documentation

## 2012-03-04 LAB — POCT WET PREP (WET MOUNT): Clue Cells Wet Prep Whiff POC: POSITIVE

## 2012-03-04 LAB — POCT UA - MICROSCOPIC ONLY

## 2012-03-04 LAB — POCT URINALYSIS DIPSTICK
Ketones, UA: 40
Leukocytes, UA: NEGATIVE
Spec Grav, UA: >=1.03

## 2012-03-04 LAB — CBC
Hemoglobin: 12.2 g/dL (ref 12.0–15.0)
MCV: 89.6 fL (ref 78.0–100.0)
Platelets: 317 10*3/uL (ref 150–400)
RBC: 4.22 MIL/uL (ref 3.87–5.11)
WBC: 4.6 10*3/uL (ref 4.0–10.5)

## 2012-03-04 LAB — BASIC METABOLIC PANEL
BUN: 11 mg/dL (ref 6–23)
CO2: 25 mEq/L (ref 19–32)
Calcium: 9.5 mg/dL (ref 8.4–10.5)
Creat: 0.77 mg/dL (ref 0.50–1.10)
Glucose, Bld: 81 mg/dL (ref 70–99)

## 2012-03-04 MED ORDER — METRONIDAZOLE 500 MG PO TABS: 500.0000 mg | ORAL_TABLET | Freq: Two times a day (BID) | ORAL | Status: DC

## 2012-03-04 MED ORDER — METRONIDAZOLE 0.75 % VA GEL: 1.0000 | Freq: Two times a day (BID) | VAGINAL | Status: AC

## 2012-03-04 NOTE — Progress Notes (Signed)
HPI:  Kellie [name] is a 36 y.o. female presenting today for evaluation of 2 weeks of LLQ abdominal pain. Location  left lower quadrant, nonradiating   Onset  2 weeks ago  Character  intermittently sharp and stabbing with constant dull pain   Severity  at a 10 out of 10 when it is severe.  Complete resolution between episodes    Temporal  lasting between couple minutes to a couple hours at a time.  Questionably associated with food but not direct correlated   Alleviating  bowel movement or flatus, avoidance of food, no alleviating positions or medications   Aggrivating  any type of food however not temporally associated as patient grazes throughout the day    ROS  Constitutional  increased fatigue, no dizziness no diaphoresis   Infectious  no fevers, no chills, no recent infections   Resp  no cough no congestion   GI  no change in bowel habits, no hematochezia, no melena, soft frequent stools but no overt diarrhea.  No nausea or vomiting, no right upper quadrant right lower quadrant pain   GU  no dysuria, no frequency no hematuria , no history of STDs, monogamous relationship with husband who has had a vasectomy, no vaginal discharge no vaginal itching, no intermittent bleeding, last period 2 weeks ago, regular every 30 day periods.    Trauma  none reported    Past Medical Hx Reviewed: yes - negative for STDs, negative for recurrent urinary tract infections, negative appendectomy or cholecystectomy Medications Reviewed: yes Family History Reviewed: yes  PE: GENERAL:  Adult AA female.  Examined in [facility]  Sitting comfortably in exam room  In mild discomfort; norespiratory distress.   PSYCH: Alert and appropriately interactive  HNEENT: AT/[place], MMM, no scleral icterus, EOMi THORAX: HEART: RRR, S1-S2 heard, no murmur LUNGS: Clear auscultation bilaterally ABDOMEN:  Hypoactive bowel sounds,  Tenderness to palpation over LLQ, referred pain to LLQ with palpation of RUQ RLQ.  Negative McBurney's  negative Murphy's.  No rigidity, negative jump test EXTREMITIES: Moves all 4 extremities spontaneously, warm well perfused, 1+/4 edema,  >PELVIC: No cervical motion tenderness, white creamy discharge, no ovarian masses felt, no tenderness to palpation in pelvic region.

## 2012-03-04 NOTE — Assessment & Plan Note (Addendum)
No fever no white count, no acute signs of infection on exam or history - will check CBC LLQ pain is more midepigastrium and pelvic.  Pelvic exam benign - BV on Wet prep - ? If this is causing pain but low likelyhood - will persue further workup with KUB. Will check UA, GC chlamydia, wet prep as patient did have discharge on exam.   Will check KUB if KUB revealed constipation We'll provide bowel cleanout; if negative Order CT Abdomen Pelvis.   Will check BMET for renal function and U Preg.

## 2012-03-04 NOTE — Patient Instructions (Addendum)
Appears that you have a condition called bacterial vaginosis.  We have given you an antibiotic cream to use vaginally twice a day for the next 7 days.  This should help clear up some of your symptoms.  Would also like to get an x-ray of your abdomen.  Please go over to [facility] radiology department to have an x-ray of your stomach done.  If your pain does not improve over the next 2-3 days please call us and we will order a CT scan to further evaluate.  We will be in touch if there are any findings on your x-ray.    Please please remember that we have an emergency line if you have questions regarding whether or not you need to be emergently evaluated in the hospital.    Please followup with Dr. Leveda Anna as previously scheduled or next week if your symptoms have not improved.  Prescription for MetroGel is waiting for you at your pharmacy.

## 2012-03-04 NOTE — Assessment & Plan Note (Addendum)
Many clue cells on Wet Prep Tx with Metrogel  X 7 days

## 2012-03-07 NOTE — Progress Notes (Signed)
  Subjective:    Patient ID: Kellie [name], female    DOB: 1975-12-13, 36 y.o.   MRN: [id number]  HPI    Review of Systems     Objective:   Physical Exam        Assessment & Plan:  Pt seen and examined by me.  Agree with Dr. Janeece Riggers assessment and plan.  BV unlikely to cause her symptoms, but will treat.  KUB reviewed.  Moderate stool.  Slightly dilated small bowel in area of tenderness.  No air fluid levels appreciated.

## 2012-09-24 ENCOUNTER — Encounter: Payer: 59 | Admitting: Family Medicine

## 2012-10-07 ENCOUNTER — Ambulatory Visit (INDEPENDENT_AMBULATORY_CARE_PROVIDER_SITE_OTHER): Payer: 59 | Admitting: Family Medicine

## 2012-10-07 ENCOUNTER — Encounter: Payer: Self-pay | Admitting: Family Medicine

## 2012-10-07 VITALS — BP 126/65 | HR 87 | Ht 68.0 in | Wt 238.0 lb

## 2012-10-07 DIAGNOSIS — Z0000 Encounter for general adult medical examination without abnormal findings: Secondary | ICD-10-CM | POA: Insufficient documentation

## 2012-10-07 NOTE — Assessment & Plan Note (Signed)
This was situational and has now resolved

## 2012-10-07 NOTE — Patient Instructions (Addendum)
Please remember to get the fasting blood work done. I will call with results.  Plus, you will be able to view on My Chart.  I'm glad you're signed up. You sound quite healthy.  The only obvious thing you need to work on is weight loss.  From a medical standpoint, I would like your BMI<30

## 2012-10-07 NOTE — Progress Notes (Signed)
  Subjective:    Patient ID: Kellie [name], female    DOB: 1976/05/17, 36 y.o.   MRN: [id number]  HPI  Here for annual check up.  Gets paps with Ob.  Husband has HPV.  She was tested and negative for high risk HPV.   Will get Tdap through employee health.  Already had flu shot. Never had cholesterol check. Knows about BMI and is aware of her need for weight loss.    Review of Systems     Objective:   Physical ExamHeent, normal Neck supple without masses Lungs, clear Cardiac RRR without m or g Abd benign Ext, no edema.        Assessment & Plan:

## 2012-10-11 ENCOUNTER — Other Ambulatory Visit: Payer: 59

## 2012-10-11 DIAGNOSIS — Z0000 Encounter for general adult medical examination without abnormal findings: Secondary | ICD-10-CM

## 2012-10-11 LAB — BASIC METABOLIC PANEL
BUN: 13 mg/dL (ref 6–23)
Calcium: 9.7 mg/dL (ref 8.4–10.5)
Glucose, Bld: 88 mg/dL (ref 70–99)

## 2012-10-11 LAB — LIPID PANEL
Cholesterol: 155 mg/dL (ref 0–200)
HDL: 54 mg/dL (ref >39)
Total CHOL/HDL Ratio: 2.9 Ratio
VLDL: 13 mg/dL (ref 0–40)

## 2012-10-11 NOTE — Progress Notes (Signed)
FLP AND BMP DONE TODAY Kellie [name] 

## 2012-10-11 NOTE — Assessment & Plan Note (Addendum)
Very healthy woman.  Only concern is weight.  Also needs fasting cholesterol and BS (BS is due to wt and +FHx)

## 2013-06-10 ENCOUNTER — Encounter: Payer: Self-pay | Admitting: Family Medicine

## 2013-06-10 ENCOUNTER — Ambulatory Visit (INDEPENDENT_AMBULATORY_CARE_PROVIDER_SITE_OTHER): Payer: 59 | Admitting: Family Medicine

## 2013-06-10 VITALS — BP 128/79 | HR 95 | Temp 98.9°F | Wt 234.0 lb

## 2013-06-10 DIAGNOSIS — R1013 Epigastric pain: Secondary | ICD-10-CM | POA: Insufficient documentation

## 2013-06-10 DIAGNOSIS — E049 Nontoxic goiter, unspecified: Secondary | ICD-10-CM

## 2013-06-10 DIAGNOSIS — E010 Iodine-deficiency related diffuse (endemic) goiter: Secondary | ICD-10-CM | POA: Insufficient documentation

## 2013-06-10 LAB — CBC
Platelets: 290 10*3/uL (ref 150–400)
RBC: 3.93 MIL/uL (ref 3.87–5.11)
RDW: 13.5 % (ref 11.5–15.5)
WBC: 4.3 10*3/uL (ref 4.0–10.5)

## 2013-06-10 LAB — COMPREHENSIVE METABOLIC PANEL
Alkaline Phosphatase: 62 U/L (ref 39–117)
BUN: 14 mg/dL (ref 6–23)
CO2: 26 mEq/L (ref 19–32)
Creat: 0.74 mg/dL (ref 0.50–1.10)
Glucose, Bld: 85 mg/dL (ref 70–99)
Total Bilirubin: 0.5 mg/dL (ref 0.3–1.2)

## 2013-06-10 LAB — LIPASE: Lipase: <10 U/L (ref 0–75)

## 2013-06-10 LAB — TSH: TSH: 0.761 u[IU]/mL (ref 0.350–4.500)

## 2013-06-10 LAB — POCT H PYLORI SCREEN: H Pylori Screen, POC: NEGATIVE

## 2013-06-10 MED ORDER — PANTOPRAZOLE SODIUM 40 MG PO TBEC: 40.0000 mg | DELAYED_RELEASE_TABLET | Freq: Every day | ORAL | Status: DC

## 2013-06-10 NOTE — Assessment & Plan Note (Signed)
Unclear etiology.  Will start with labs and PPI.  May need imaging but will await initial test results.

## 2013-06-10 NOTE — Progress Notes (Signed)
  Subjective:    Patient ID: Kellie [name], female    DOB: July 15, 1976, 37 y.o.   MRN: [id number]  HPI Epigastric pain for three weeks. Some nausea.   No vomiting.  Midline to Lt UQ.  Seems worse after eating.  EtOH made worse so she has cut back.  Tells me there is absolutely no chance that she is pregnant.  Takes occasional aleve for headache - none recently.  No previous abd pain.  No change in bowels.  No bleeding.  No new meds or diet    Review of Systems     Objective:   Physical ExamLungs clear Cardiac RRR without m or g Abd benign.  No masses organomegally or tenderness Incidental finding of thyromegaly on exam         Assessment & Plan:

## 2013-06-10 NOTE — Assessment & Plan Note (Signed)
Check TSH 

## 2013-06-10 NOTE — Patient Instructions (Addendum)
Your exam seems normal I will call Monday with the blood work results. Start taking the protonix regularly. Depending on how you do and what the blood work shows, we may need to take additional steps.

## 2013-06-21 ENCOUNTER — Encounter: Payer: Self-pay | Admitting: Family Medicine

## 2013-06-21 DIAGNOSIS — R110 Nausea: Secondary | ICD-10-CM

## 2013-06-23 ENCOUNTER — Telehealth: Payer: Self-pay | Admitting: *Deleted

## 2013-06-23 NOTE — Telephone Encounter (Signed)
Patient is scheduled tomorrow for the US.

## 2013-06-23 NOTE — Telephone Encounter (Signed)
Next step is to order abd ultrasound, with gall bladder disease being a common problem

## 2013-06-24 ENCOUNTER — Ambulatory Visit (HOSPITAL_COMMUNITY)
Admission: RE | Admit: 2013-06-24 | Discharge: 2013-06-24 | Disposition: A | Discharge status: Home / Self Care | Payer: 59 | Source: Ambulatory Visit | Attending: Family Medicine | Admitting: Family Medicine

## 2013-06-24 DIAGNOSIS — R110 Nausea: Secondary | ICD-10-CM | POA: Insufficient documentation

## 2013-07-08 ENCOUNTER — Encounter: Payer: Self-pay | Admitting: Family Medicine

## 2013-10-06 ENCOUNTER — Other Ambulatory Visit: Payer: Self-pay

## 2014-02-10 ENCOUNTER — Ambulatory Visit (INDEPENDENT_AMBULATORY_CARE_PROVIDER_SITE_OTHER): Payer: 59 | Admitting: Family Medicine

## 2014-02-10 ENCOUNTER — Encounter: Payer: Self-pay | Admitting: Family Medicine

## 2014-02-10 VITALS — BP 128/86 | HR 91 | Temp 99.2°F | Ht 68.0 in | Wt 260.0 lb

## 2014-02-10 DIAGNOSIS — Z13220 Encounter for screening for lipoid disorders: Secondary | ICD-10-CM | POA: Insufficient documentation

## 2014-02-10 DIAGNOSIS — R531 Weakness: Secondary | ICD-10-CM | POA: Insufficient documentation

## 2014-02-10 DIAGNOSIS — E049 Nontoxic goiter, unspecified: Secondary | ICD-10-CM

## 2014-02-10 DIAGNOSIS — E010 Iodine-deficiency related diffuse (endemic) goiter: Secondary | ICD-10-CM

## 2014-02-10 DIAGNOSIS — Z0000 Encounter for general adult medical examination without abnormal findings: Secondary | ICD-10-CM

## 2014-02-10 DIAGNOSIS — R5381 Other malaise: Secondary | ICD-10-CM

## 2014-02-10 DIAGNOSIS — R5383 Other fatigue: Secondary | ICD-10-CM

## 2014-02-10 LAB — LIPID PANEL
CHOL/HDL RATIO: 2.7 ratio
Cholesterol: 157 mg/dL (ref 0–200)
HDL: 58 mg/dL (ref >39)
LDL Cholesterol: 90 mg/dL (ref 0–99)
Triglycerides: 45 mg/dL (ref <150)
VLDL: 9 mg/dL (ref 0–40)

## 2014-02-10 LAB — CBC
HCT: 35 % — ABNORMAL LOW (ref 36.0–46.0)
HEMOGLOBIN: 11.7 g/dL — AB (ref 12.0–15.0)
MCH: 29 pg (ref 26.0–34.0)
MCHC: 33.4 g/dL (ref 30.0–36.0)
MCV: 86.6 fL (ref 78.0–100.0)
Platelets: 287 10*3/uL (ref 150–400)
RBC: 4.04 MIL/uL (ref 3.87–5.11)
RDW: 13.6 % (ref 11.5–15.5)
WBC: 4.4 10*3/uL (ref 4.0–10.5)

## 2014-02-10 LAB — TSH: TSH: 0.791 u[IU]/mL (ref 0.350–4.500)

## 2014-02-10 NOTE — Progress Notes (Signed)
   Subjective:    Patient ID: Kellie [name], female    DOB: [birth year], 38 y.o.   MRN: [id number]  HPI  Annual physical.  Gets Paps done by Gyn.  Only complaint is weakness and fatigue.  Recognizes that she has not been exercising.  Also, has gained significant weight since last visit.  Spirits are good.  Largely up to date on HPDP measures.  Non smoker.    Review of Systems Denies chest pain, frequent headaches, DOE, bleeding, bowel or bladder changes, skin changes.     Objective:   Physical ExamHEENT normal Neck generous thyroid gland.  No sig nodes Lungs clear Cardiac RRR without m or g Abd benign Ext no edema        Assessment & Plan:

## 2014-02-10 NOTE — Assessment & Plan Note (Signed)
Check labs 

## 2014-02-10 NOTE — Assessment & Plan Note (Signed)
Generally healthy.  Needs diet and exercise.

## 2014-02-10 NOTE — Patient Instructions (Signed)
Check your my chart for blood test results.  I will call if abnormal Likely, your problems are due to weight and lack of exercise.  It is bad that you have gained 20+ lbs since last July.   Check out the [facility] wellness benefits.  They have good programs to help you improve your lifestyle.

## 2014-02-10 NOTE — Assessment & Plan Note (Signed)
Feel likely due to weight gain and lack of exercise.  Will look for organic causes.

## 2014-09-15 ENCOUNTER — Other Ambulatory Visit: Payer: Self-pay

## 2014-12-21 ENCOUNTER — Encounter: Payer: Self-pay | Admitting: Dietician

## 2014-12-21 ENCOUNTER — Encounter: Payer: 59 | Attending: Family Medicine | Admitting: Dietician

## 2014-12-21 VITALS — Ht 67.25 in | Wt 267.0 lb

## 2014-12-21 DIAGNOSIS — Z6841 Body Mass Index (BMI) 40.0 and over, adult: Secondary | ICD-10-CM | POA: Diagnosis not present

## 2014-12-21 DIAGNOSIS — E669 Obesity, unspecified: Secondary | ICD-10-CM | POA: Insufficient documentation

## 2014-12-21 DIAGNOSIS — Z713 Dietary counseling and surveillance: Secondary | ICD-10-CM | POA: Insufficient documentation

## 2014-12-21 NOTE — Progress Notes (Signed)
  Medical Nutrition Therapy:  Appt start time: 0915 end time:  1010.   Assessment:  Primary concerns today: Kellie [name] is here today since she is trying to avoid diabetes. Her grandmother has a hx of diabetes. Does not have a hx of elevated blood sugar that she knows. Has not made any changes to her diet/exericse recently.  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 Lives with her husband and 2 kids. Her husband does the food shopping and meal preparation. Will often skip breakfast about 4 x week and might skip dinner occasionally. Eats out breakfast and lunch (at the cafeteria) most days and eats dinner out about 3 x week.   Would like to increase her water intake and overall work on her portion sizes.   Preferred Learning Style:   No preference indicated   Learning Readiness:   Ready  MEDICATIONS: none   DIETARY INTAKE:  Usual eating pattern includes 2-3 meals and 0 snacks per day.  Avoided foods include: beets, liver, mushrooms, olives, brussels sprouts  24-hr recall:  B ( AM): sausage egg and cheese biscuit sometimes with coffee or skips  Snk ( AM): none L ( PM): cafeteria - pizza or "whatever"  Snk ( PM):none D ( PM): husband will make a meat and vegetable or will have pizza Snk ( PM): none Beverages: 20 oz [place] juice, drinks wine at night (1 bottle per week), beer (4 per week), some days will drink nothing the entire   Usual physical activity: none  Estimated energy needs: 1800 calories 200 g carbohydrates 135 g protein 50 g fat  Progress Towards Goal(s):  In progress.   Nutritional Diagnosis:  [place]-3.3 Overweight/obesity As related to hx of meal skipping and energy dense food choices.  As evidenced by BMI of 41.5.    Intervention:  Nutrition counseling provided. Plan: Ask your doctor to check iron and Vitamin D levels.  Plan to walk the track when your boys are playing basketball (1 x week). Aim to eat 3 meals per day and  2 snacks if you are hungry. Plan to carry your water bottle wherever you go and sip every few minutes. For breakfast - get a breakfast sandwich on whole wheat bread or try a protein shake with a fruit/crackers/carb or [employer] or yogurt For lunch and dinner, aim to fill half of your plate with vegetables. Limit protein (not fried) and carbs to a quarter of your plate (size of the palm of your hand). Have snacks available in case you are hungry - have protein and carbs together. Portion them out.  Take time on days off to plan meals and snacks.   Teaching Method Utilized:  Visual Auditory Hands on  Handouts given during visit include:  MyPlate Handout  52D CHO Snacks  Yellow Card  Barriers to learning/adherence to lifestyle change: none  Demonstrated degree of understanding via:  Teach Back   Monitoring/Evaluation:  Dietary intake, exercise, and body weight in 6 week(s).

## 2014-12-21 NOTE — Patient Instructions (Addendum)
Ask your doctor to check iron and Vitamin D levels.  Plan to walk the track when your boys are playing basketball (1 x week). Aim to eat 3 meals per day and 2 snacks if you are hungry. Plan to carry your water bottle wherever you go and sip every few minutes. For breakfast - get a breakfast sandwich on whole wheat bread or try a protein shake with a fruit/crackers/carb or [employer] or yogurt For lunch and dinner, aim to fill half of your plate with vegetables. Limit protein (not fried) and carbs to a quarter of your plate (size of the palm of your hand). Have snacks available in case you are hungry - have protein and carbs together. Portion them out.  Take time on days off to plan meals and snacks.

## 2015-01-26 ENCOUNTER — Other Ambulatory Visit (HOSPITAL_COMMUNITY): Payer: Self-pay | Admitting: Gastroenterology

## 2015-01-26 DIAGNOSIS — R1032 Left lower quadrant pain: Secondary | ICD-10-CM

## 2015-01-29 ENCOUNTER — Ambulatory Visit (HOSPITAL_COMMUNITY)
Admission: RE | Admit: 2015-01-29 | Discharge: 2015-01-29 | Disposition: A | Discharge status: Home / Self Care | Payer: 59 | Source: Ambulatory Visit | Attending: Gastroenterology | Admitting: Gastroenterology

## 2015-01-29 DIAGNOSIS — R110 Nausea: Secondary | ICD-10-CM | POA: Diagnosis not present

## 2015-01-29 DIAGNOSIS — R197 Diarrhea, unspecified: Secondary | ICD-10-CM | POA: Insufficient documentation

## 2015-01-29 DIAGNOSIS — R1032 Left lower quadrant pain: Secondary | ICD-10-CM | POA: Insufficient documentation

## 2015-01-29 MED ORDER — IOHEXOL 300 MG/ML  SOLN
100.0000 mL | Freq: Once | INTRAMUSCULAR | Status: AC | PRN
  Administered 2015-01-29: 100 mL via INTRAVENOUS

## 2015-02-01 ENCOUNTER — Encounter: Payer: 59 | Attending: Family Medicine | Admitting: Dietician

## 2015-02-01 ENCOUNTER — Ambulatory Visit: Payer: 59 | Admitting: Dietician

## 2015-02-01 VITALS — Ht 67.25 in | Wt 253.8 lb

## 2015-02-01 DIAGNOSIS — Z713 Dietary counseling and surveillance: Secondary | ICD-10-CM | POA: Insufficient documentation

## 2015-02-01 DIAGNOSIS — E669 Obesity, unspecified: Secondary | ICD-10-CM | POA: Insufficient documentation

## 2015-02-01 DIAGNOSIS — Z6841 Body Mass Index (BMI) 40.0 and over, adult: Secondary | ICD-10-CM | POA: Insufficient documentation

## 2015-02-01 NOTE — Patient Instructions (Addendum)
Ask your doctor to check iron and Vitamin D levels.  Continue to eat 3 meals per day and 2 snacks if you are hungry. Continue to carry your water bottle wherever you go and sip every few minutes. Continue to fill half of your plate with vegetables. Continue to have snacks available in case you are hungry - have protein and carbs together. Portion them out.  Think about taking time on days off to plan meals and snacks.

## 2015-02-01 NOTE — Progress Notes (Signed)
  Medical Nutrition Therapy:  Appt start time: 0205 end time:  220.   Assessment:  Primary concerns today: Kellie [name] is here today since she is trying to avoid diabetes. Returns with a 14 lb weight loss. Has been increasing her water intake and watching portion sizes. No longer skipping breakfast. Has cut back a lot on alcohol. Started getting 10,000 steps per day. Got rid of unhealthy food in house and cut out fried foods and sodas.   Not feeling as sleepy as before and has had less headaches. Overall doing great!  Wt Readings from Last 3 Encounters:  02/01/15 253 lb 12.8 oz (115.123 kg)  12/21/14 267 lb (121.11 kg)  02/10/14 260 lb (117.935 kg)   Ht Readings from Last 3 Encounters:  02/01/15 5' 7.25" (1.708 m)  12/21/14 5' 7.25" (1.708 m)  02/10/14 5\' 8"  (1.727 m)   Body mass index is 39.46 kg/(m^2). @BMIFA @ Normalized weight-for-age data available only for age 66 to 53 years. Normalized stature-for-age data available only for age 66 to 42 years.   Preferred Learning Style:   No preference indicated   Learning Readiness:   Ready  MEDICATIONS: none   DIETARY INTAKE:  Usual eating pattern includes 2-3 meals and 0 snacks per day.  Avoided foods include: beets, liver, mushrooms, olives, brussels sprouts  24-hr recall:  B ( AM): yogurt with protein bar or boiled egg with fruit  Snk ( AM): sometimes will have almonds or protein bar L ( PM): lean cuisine and a salad or [place] sandwich and salad/vegetable Snk ( PM):sometimes will have almonds or protein bar D ( PM): husband will make a meat and vegetable  Snk ( PM): almonds Beverages: water, wine and beer very rarely lately  Usual physical activity: 10000 steps everyday (Fitbit)  Estimated energy needs: 1800 calories 200 g carbohydrates 135 g protein 50 g fat  Progress Towards Goal(s):  In progress.   Nutritional Diagnosis:  [place]-3.3 Overweight/obesity As related to hx of meal skipping and energy dense food choices.  As  evidenced by BMI of 41.5.    Intervention:  Nutrition counseling provided. Plan: Ask your doctor to check iron and Vitamin D levels.  Continue to eat 3 meals per day and 2 snacks if you are hungry. Continue to carry your water bottle wherever you go and sip every few minutes. Continue to fill half of your plate with vegetables. Continue to have snacks available in case you are hungry - have protein and carbs together. Portion them out.  Think about taking time on days off to plan meals and snacks.   Teaching Method Utilized:  Visual Auditory Hands on  Handouts given during visit include:  MyPlate Handout  33A CHO Snacks  Yellow Card  Barriers to learning/adherence to lifestyle change: none  Demonstrated degree of understanding via:  Teach Back   Monitoring/Evaluation:  Dietary intake, exercise, and body weight in 6 week(s).

## 2015-03-15 ENCOUNTER — Encounter: Payer: 59 | Attending: Family Medicine | Admitting: Dietician

## 2015-03-15 VITALS — Ht 67.5 in | Wt 246.5 lb

## 2015-03-15 DIAGNOSIS — Z713 Dietary counseling and surveillance: Secondary | ICD-10-CM | POA: Diagnosis not present

## 2015-03-15 DIAGNOSIS — E669 Obesity, unspecified: Secondary | ICD-10-CM | POA: Diagnosis present

## 2015-03-15 DIAGNOSIS — Z6841 Body Mass Index (BMI) 40.0 and over, adult: Secondary | ICD-10-CM | POA: Insufficient documentation

## 2015-03-15 NOTE — Patient Instructions (Addendum)
Ask your doctor to check iron, Hgb A1c, and Vitamin D levels.  Continue to eat 3 meals per day and 2 snacks if you are hungry. Continue to carry your water bottle wherever you go and sip every few minutes. Continue to fill half of your plate with vegetables. Continue to have snacks available in case you are hungry - have protein and carbs together. Portion them out.  Think about taking time on days off to plan meals and snacks.  Try not to drink fluid 2 hours before bed so that you don't have to get up as much overnight.

## 2015-03-15 NOTE — Progress Notes (Signed)
  Medical Nutrition Therapy:  Appt start time: 0425 end time: 440 .   Assessment:  Primary concerns today: Kellie [name] is here today since she is trying to avoid diabetes. Returns with a 6 lb weight loss. Had a vacation during the past month and "ate badly" when she was there. Getting back on track since she had been back. Still feeling tired. Gets up a lot to go to the bathroom at night time. Getting 10,000 steps per day still. Will be adding 10 flights of stair.   Headaches are getting better with eating more consistently.   Wt Readings from Last 3 Encounters:  03/15/15 246 lb 8 oz (111.812 kg)  02/01/15 253 lb 12.8 oz (115.123 kg)  12/21/14 267 lb (121.11 kg)   Ht Readings from Last 3 Encounters:  03/15/15 5' 7.5" (1.715 m)  02/01/15 5' 7.25" (1.708 m)  12/21/14 5' 7.25" (1.708 m)   Body mass index is 38.02 kg/(m^2). @BMIFA @ Normalized weight-for-age data available only for age 71 to 69 years. Normalized stature-for-age data available only for age 71 to 52 years.   Preferred Learning Style:   No preference indicated   Learning Readiness:   Ready  MEDICATIONS: none   DIETARY INTAKE:  Usual eating pattern includes 2-3 meals and 0 snacks per day.  Avoided foods include: beets, liver, mushrooms, olives, brussels sprouts  24-hr recall:  B ( AM): yogurt with protein bar or boiled egg with fruit  Snk ( AM): sometimes will have almonds or protein bar L ( PM): lean cuisine and a salad or [place] sandwich and salad/vegetable Snk ( PM):sometimes will have almonds or protein bar D ( PM): husband will make a meat and vegetable  Snk ( PM): almonds Beverages: water, wine and beer very rarely lately  Usual physical activity: 10000 steps everyday (Fitbit)  Estimated energy needs: 1800 calories 200 g carbohydrates 135 g protein 50 g fat  Progress Towards Goal(s):  In progress.   Nutritional Diagnosis:  [place]-3.3 Overweight/obesity As related to hx of meal skipping and energy dense  food choices.  As evidenced by BMI of 41.5.    Intervention:  Nutrition counseling provided. Plan: Ask your doctor to check iron, Hgb A1c, and Vitamin D levels.  Continue to eat 3 meals per day and 2 snacks if you are hungry. Continue to carry your water bottle wherever you go and sip every few minutes. Continue to fill half of your plate with vegetables. Continue to have snacks available in case you are hungry - have protein and carbs together. Portion them out.  Think about taking time on days off to plan meals and snacks.  Try not to drink fluid 2 hours before bed so that you don't have to get up as much overnight.   Teaching Method Utilized:  Visual Auditory Hands on   Barriers to learning/adherence to lifestyle change: none  Demonstrated degree of understanding via:  Teach Back   Monitoring/Evaluation:  Dietary intake, exercise, and body weight in 6 week(s).

## 2015-04-26 ENCOUNTER — Encounter: Payer: 59 | Attending: Family Medicine | Admitting: Dietician

## 2015-04-26 ENCOUNTER — Encounter: Payer: Self-pay | Admitting: Dietician

## 2015-04-26 VITALS — Ht 67.5 in | Wt 237.4 lb

## 2015-04-26 DIAGNOSIS — E669 Obesity, unspecified: Secondary | ICD-10-CM | POA: Diagnosis not present

## 2015-04-26 DIAGNOSIS — Z6841 Body Mass Index (BMI) 40.0 and over, adult: Secondary | ICD-10-CM | POA: Diagnosis not present

## 2015-04-26 DIAGNOSIS — Z713 Dietary counseling and surveillance: Secondary | ICD-10-CM | POA: Diagnosis not present

## 2015-04-26 NOTE — Patient Instructions (Signed)
Think about getting a physical  - ask your doctor to check iron and Vitamin D levels.  Continue to eat 3 meals per day and 2 snacks if you are hungry. Continue to fill half of your plate with vegetables. Continue to have snacks available in case you are hungry - have protein and carbs together. Portion them out.  Continue to not to drink fluid 2 hours before bed so that you don't have to get up as much overnight.  If you want to have a Premier Protein shake, have it with some fruit or other carbs.   Great job on losing 30 lbs!

## 2015-04-26 NOTE — Progress Notes (Signed)
  Medical Nutrition Therapy:  Appt start time: 0315 end time: 435 .   Assessment:  Primary concerns today: Kellie [name] is here today since she is trying to avoid diabetes. Returns with a 8.6 lb in the past 6 weeks. Has lost 30 lbs since January! Had Hgb A1c test recently when was good, though she is not sure what it was. Was diagnosed with diverticulosis but has not make changes to her diets.   Getting 20,000 steps per day - doing a step challenge. Doing 10 flights of stairs per day.   Husband does most of the cooking.   Wt Readings from Last 3 Encounters:  04/26/15 237 lb 6.4 oz (107.684 kg)  03/15/15 246 lb 8 oz (111.812 kg)  02/01/15 253 lb 12.8 oz (115.123 kg)   Ht Readings from Last 3 Encounters:  04/26/15 5' 7.5" (1.715 m)  03/15/15 5' 7.5" (1.715 m)  02/01/15 5' 7.25" (1.708 m)   Body mass index is 36.61 kg/(m^2). @BMIFA @ Normalized weight-for-age data available only for age 27 to 52 years. Normalized stature-for-age data available only for age 27 to 3 years.   Preferred Learning Style:   No preference indicated   Learning Readiness:   Ready  MEDICATIONS: none   DIETARY INTAKE:  Usual eating pattern includes 2-3 meals and 0 snacks per day.  Avoided foods include: beets, liver, mushrooms, olives, brussels sprouts  24-hr recall:  B ( AM): yogurt with protein bar or boiled egg with fruit  Snk ( AM): sometimes will have almonds or protein bar L ( PM): lean cuisine and a salad or [place] sandwich and salad/vegetable Snk ( PM):sometimes will have almonds or protein bar D ( PM): husband will make a meat and vegetable  Snk ( PM): almonds Beverages: water, wine and beer very rarely lately  Usual physical activity: 20000 steps everyday (Fitbit)  Estimated energy needs: 1800 calories 200 g carbohydrates 135 g protein 50 g fat  Progress Towards Goal(s):  In progress.   Nutritional Diagnosis:  [place]-3.3 Overweight/obesity As related to hx of meal skipping and energy dense  food choices.  As evidenced by BMI of 41.5.    Intervention:  Nutrition counseling provided. Plan: Think about getting a physical  - ask your doctor to check iron and Vitamin D levels.  Continue to eat 3 meals per day and 2 snacks if you are hungry. Continue to fill half of your plate with vegetables. Continue to have snacks available in case you are hungry - have protein and carbs together. Portion them out.  Continue to not to drink fluid 2 hours before bed so that you don't have to get up as much overnight.  If you want to have a Premier Protein shake, have it with some fruit or other carbs.    Teaching Method Utilized:  Visual Auditory Hands on   Barriers to learning/adherence to lifestyle change: none  Demonstrated degree of understanding via:  Teach Back   Monitoring/Evaluation:  Dietary intake, exercise, and body weight in 3 month(s).

## 2015-07-18 ENCOUNTER — Encounter: Payer: Self-pay | Admitting: Dietician

## 2015-07-18 ENCOUNTER — Encounter: Payer: 59 | Attending: Family Medicine | Admitting: Dietician

## 2015-07-18 VITALS — Ht 67.5 in | Wt 238.1 lb

## 2015-07-18 DIAGNOSIS — Z713 Dietary counseling and surveillance: Secondary | ICD-10-CM | POA: Diagnosis not present

## 2015-07-18 DIAGNOSIS — Z6836 Body mass index (BMI) 36.0-36.9, adult: Secondary | ICD-10-CM | POA: Insufficient documentation

## 2015-07-18 DIAGNOSIS — E669 Obesity, unspecified: Secondary | ICD-10-CM | POA: Insufficient documentation

## 2015-07-18 NOTE — Progress Notes (Signed)
  Medical Nutrition Therapy:  Appt start time: 0430 end time: 445 .   Assessment:  Primary concerns today: Kellie [name] is here today since she is trying to avoid diabetes. Returns with a 1 lb weight gain since May. Has "fallen off the wagon" in June after going to [place]. Stopped walking and wearing fitbit since it was making her wrist sweat. Has been working more shifts at the hospital. Skipping meals.   Wt Readings from Last 3 Encounters:  07/18/15 238 lb 1.6 oz (108.001 kg)  04/26/15 237 lb 6.4 oz (107.684 kg)  03/15/15 246 lb 8 oz (111.812 kg)   Ht Readings from Last 3 Encounters:  07/18/15 5' 7.5" (1.715 m)  04/26/15 5' 7.5" (1.715 m)  03/15/15 5' 7.5" (1.715 m)   Body mass index is 36.72 kg/(m^2). @BMIFA @ Normalized weight-for-age data available only for age 31 to 72 years. Normalized stature-for-age data available only for age 31 to 79 years.   Preferred Learning Style:   No preference indicated   Learning Readiness:   Ready  MEDICATIONS: none   DIETARY INTAKE:  Usual eating pattern includes 2-3 meals and 0 snacks per day.  Avoided foods include: beets, liver, mushrooms, olives, brussels sprouts  24-hr recall:  B ( AM): sausage or asiago bagel or skips Snk ( AM): none L ( PM): cafeteria lunch - vegetables  Snk ( PM):none D ( PM): husband making less healthy choices - [place] burgers/sausage Snk ( PM): none Beverages: water, beer  Usual physical activity: not as much as before  Estimated energy needs: 1800 calories 200 g carbohydrates 135 g protein 50 g fat  Progress Towards Goal(s):  In progress.   Nutritional Diagnosis:  [place]-3.3 Overweight/obesity As related to hx of meal skipping and energy dense food choices.  As evidenced by BMI of 41.5.    Intervention:  Nutrition counseling provided. Plan: Plan to get a physical in October - ask your doctor to check iron and Vitamin D levels. Try taking multivitamin and Vitamin D.   Have eat 3 meals per day and 2  snacks if you are hungry. Filling half of your plate with vegetables. Have snacks available in case you are hungry - have protein and carbs together. Portion them out. Drink more water. If you want to have a Premier Protein shake, have it with some fruit or other carbs.  Get back to exercising regularly.   Teaching Method Utilized:  Visual Auditory Hands on   Barriers to learning/adherence to lifestyle change: none  Demonstrated degree of understanding via:  Teach Back   Monitoring/Evaluation:  Dietary intake, exercise, and body weight in 3 month(s).

## 2015-07-18 NOTE — Patient Instructions (Addendum)
Plan to get a physical in October - ask your doctor to check iron and Vitamin D levels. Try taking multivitamin and Vitamin D.   Have eat 3 meals per day and 2 snacks if you are hungry. Filling half of your plate with vegetables. Have snacks available in case you are hungry - have protein and carbs together. Portion them out. Drink more water. If you want to have a Premier Protein shake, have it with some fruit or other carbs.  Get back to exercising regularly.

## 2015-08-29 ENCOUNTER — Ambulatory Visit: Payer: 59 | Admitting: Dietician

## 2015-11-28 ENCOUNTER — Encounter: Payer: 59 | Attending: Family Medicine | Admitting: Dietician

## 2015-11-28 ENCOUNTER — Encounter: Payer: Self-pay | Admitting: Dietician

## 2015-11-28 VITALS — Wt 244.7 lb

## 2015-11-28 DIAGNOSIS — E669 Obesity, unspecified: Secondary | ICD-10-CM | POA: Diagnosis not present

## 2015-11-28 DIAGNOSIS — Z6837 Body mass index (BMI) 37.0-37.9, adult: Secondary | ICD-10-CM | POA: Diagnosis not present

## 2015-11-28 DIAGNOSIS — Z713 Dietary counseling and surveillance: Secondary | ICD-10-CM | POA: Insufficient documentation

## 2015-11-28 NOTE — Patient Instructions (Addendum)
Try taking multivitamin and Vitamin D.   Have eat 3 meals per day and 2 snacks if you are hungry. Filling half of your plate with vegetables. Have snacks available in case you are hungry - have protein and carbs together. Portion them out. If you want to have a Premier Protein shake, have it with some fruit or other carbs.   Drink more water and reduce alcohol intake Get back to exercising regularly.  -Plan to go out and walk each day that it is nice out (not raining and between 50-75 degrees) As you get back into your routine, work on developing structure with meal pattern  -Ask husband to make extra at dinner for your lunches Try keeping a food log

## 2015-11-28 NOTE — Progress Notes (Signed)
  Medical Nutrition Therapy:  Appt start time: 355 end time: 415   Follow up:  Primary concerns today: Kellie [name] returns for weight management and diabetes prevention. She has gained 6 pounds in the last 4 months. She reports that she is back to her sedentary lifestyle. Plans to have follow up appt and bloodwork with PCP in January. Has not started taking multivitamin. Plans to start taking Biotin per dermatologist request. She identifies that she likes to exercise outside and that she and her children walked 8 miles recently. Drinking less water and more beer and wine.    Wt Readings from Last 3 Encounters:  11/28/15 244 lb 11.2 oz (110.995 kg)  07/18/15 238 lb 1.6 oz (108.001 kg)  04/26/15 237 lb 6.4 oz (107.684 kg)   Ht Readings from Last 3 Encounters:  07/18/15 5' 7.5" (1.715 m)  04/26/15 5' 7.5" (1.715 m)  03/15/15 5' 7.5" (1.715 m)   There is no weight on file to calculate BMI. @BMIFA @ Normalized weight-for-age data available only for age 17 to 51 years. Normalized stature-for-age data available only for age 17 to 30 years.   Preferred Learning Style:   No preference indicated   Learning Readiness:   Ready  MEDICATIONS: none   DIETARY INTAKE:  Usual eating pattern includes 2-3 meals and 0 snacks per day.  Avoided foods include: beets, liver, mushrooms, olives, brussels sprouts  24-hr recall:  B ( AM): sausage or asiago bagel or skips Snk ( AM): none L ( PM): cafeteria lunch - vegetables  Snk ( PM):none D ( PM): husband making less healthy choices - [place] burgers/sausage Snk ( PM): none Beverages: water, beer  Usual physical activity: not as much as before  Estimated energy needs: 1800 calories 200 g carbohydrates 135 g protein 50 g fat  Progress Towards Goal(s):  In progress.   Nutritional Diagnosis:  [place]-3.3 Overweight/obesity As related to hx of meal skipping and energy dense food choices.  As evidenced by BMI of 41.5.    Intervention:  Nutrition  counseling provided. Plan: Try taking multivitamin and Vitamin D.   Have eat 3 meals per day and 2 snacks if you are hungry. Filling half of your plate with vegetables. Have snacks available in case you are hungry - have protein and carbs together. Portion them out. If you want to have a Premier Protein shake, have it with some fruit or other carbs.  Drink more water and reduce alcohol intake Get back to exercising regularly.  -Plan to go out and walk each day that it is nice out (not raining and between 50-75 degrees) As you get back into your routine, work on developing structure with meal pattern  -Ask husband to make extra at dinner for your lunches Try keeping a food log    Teaching Method Utilized:  Visual Auditory Hands on   Barriers to learning/adherence to lifestyle change: none  Demonstrated degree of understanding via:  Teach Back   Monitoring/Evaluation:  Dietary intake, exercise, and body weight in 6 week(s).

## 2015-12-14 DIAGNOSIS — N393 Stress incontinence (female) (male): Secondary | ICD-10-CM | POA: Diagnosis not present

## 2015-12-14 DIAGNOSIS — N3941 Urge incontinence: Secondary | ICD-10-CM | POA: Diagnosis not present

## 2015-12-14 DIAGNOSIS — Z1151 Encounter for screening for human papillomavirus (HPV): Secondary | ICD-10-CM | POA: Diagnosis not present

## 2015-12-14 DIAGNOSIS — Z1231 Encounter for screening mammogram for malignant neoplasm of breast: Secondary | ICD-10-CM | POA: Diagnosis not present

## 2015-12-14 DIAGNOSIS — Z113 Encounter for screening for infections with a predominantly sexual mode of transmission: Secondary | ICD-10-CM | POA: Diagnosis not present

## 2015-12-14 DIAGNOSIS — Z01419 Encounter for gynecological examination (general) (routine) without abnormal findings: Secondary | ICD-10-CM | POA: Diagnosis not present

## 2015-12-19 ENCOUNTER — Encounter: Payer: 59 | Admitting: Family Medicine

## 2015-12-25 MED FILL — CLEOCIN 100 MG VAGINAL OVUL: 100 | 3 days supply | Qty: 3 | Fill #0

## 2016-01-09 ENCOUNTER — Ambulatory Visit: Payer: 59 | Admitting: Dietician

## 2016-03-04 DIAGNOSIS — B351 Tinea unguium: Secondary | ICD-10-CM | POA: Diagnosis not present

## 2016-03-04 DIAGNOSIS — L600 Ingrowing nail: Secondary | ICD-10-CM | POA: Diagnosis not present

## 2016-03-04 MED FILL — TERBINAFINE HCL 250 MG TAB: 250 | 30 days supply | Qty: 30 | Fill #0

## 2016-03-04 MED FILL — CEPHALEXIN 500 MG CAPSULE: 500 | 7 days supply | Qty: 21 | Fill #0

## 2016-04-01 DIAGNOSIS — B351 Tinea unguium: Secondary | ICD-10-CM | POA: Diagnosis not present

## 2016-04-16 MED FILL — TERBINAFINE HCL 250 MG TAB: 250 | 30 days supply | Qty: 30 | Fill #0

## 2016-07-21 ENCOUNTER — Other Ambulatory Visit (HOSPITAL_COMMUNITY): Payer: Self-pay | Admitting: Orthopedic Surgery

## 2016-07-21 ENCOUNTER — Ambulatory Visit (HOSPITAL_COMMUNITY)
Admission: RE | Admit: 2016-07-21 | Discharge: 2016-07-21 | Disposition: A | Discharge status: Home / Self Care | Payer: 59 | Source: Ambulatory Visit | Attending: Orthopedic Surgery | Admitting: Orthopedic Surgery

## 2016-07-21 DIAGNOSIS — R600 Localized edema: Secondary | ICD-10-CM | POA: Diagnosis not present

## 2016-07-21 DIAGNOSIS — S83241A Other tear of medial meniscus, current injury, right knee, initial encounter: Secondary | ICD-10-CM

## 2016-07-21 DIAGNOSIS — M25561 Pain in right knee: Secondary | ICD-10-CM | POA: Diagnosis not present

## 2016-07-21 MED FILL — OMEPRAZOLE DR 20 MG CAPSULE: 20 | 30 days supply | Qty: 30 | Fill #0

## 2016-08-01 DIAGNOSIS — M25561 Pain in right knee: Secondary | ICD-10-CM | POA: Diagnosis not present

## 2017-03-27 DIAGNOSIS — Z1151 Encounter for screening for human papillomavirus (HPV): Secondary | ICD-10-CM | POA: Diagnosis not present

## 2017-03-27 DIAGNOSIS — Z6841 Body Mass Index (BMI) 40.0 and over, adult: Secondary | ICD-10-CM | POA: Diagnosis not present

## 2017-03-27 DIAGNOSIS — Z1329 Encounter for screening for other suspected endocrine disorder: Secondary | ICD-10-CM | POA: Diagnosis not present

## 2017-03-27 DIAGNOSIS — Z131 Encounter for screening for diabetes mellitus: Secondary | ICD-10-CM | POA: Diagnosis not present

## 2017-03-27 DIAGNOSIS — Z0000 Encounter for general adult medical examination without abnormal findings: Secondary | ICD-10-CM | POA: Diagnosis not present

## 2017-03-27 DIAGNOSIS — Z13220 Encounter for screening for lipoid disorders: Secondary | ICD-10-CM | POA: Diagnosis not present

## 2017-03-27 DIAGNOSIS — Z1231 Encounter for screening mammogram for malignant neoplasm of breast: Secondary | ICD-10-CM | POA: Diagnosis not present

## 2017-03-27 DIAGNOSIS — Z130 Encounter for screening for diseases of the blood and blood-forming organs and certain disorders involving the immune mechanism: Secondary | ICD-10-CM | POA: Diagnosis not present

## 2017-03-27 DIAGNOSIS — R350 Frequency of micturition: Secondary | ICD-10-CM | POA: Diagnosis not present

## 2017-03-27 DIAGNOSIS — Z01419 Encounter for gynecological examination (general) (routine) without abnormal findings: Secondary | ICD-10-CM | POA: Diagnosis not present

## 2017-03-27 MED FILL — ESCITALOPRAM 20 MG TABLET: 20 | 30 days supply | Qty: 30 | Fill #0

## 2017-04-06 MED FILL — VIT D2 1.25 MG (50,000 UNIT: 1.25 MG | 28 days supply | Qty: 4 | Fill #0

## 2017-09-09 ENCOUNTER — Encounter: Payer: Self-pay | Admitting: Family Medicine

## 2017-09-09 ENCOUNTER — Ambulatory Visit (INDEPENDENT_AMBULATORY_CARE_PROVIDER_SITE_OTHER): Payer: 59 | Admitting: Family Medicine

## 2017-09-09 DIAGNOSIS — Z0000 Encounter for general adult medical examination without abnormal findings: Secondary | ICD-10-CM | POA: Diagnosis not present

## 2017-09-09 DIAGNOSIS — E669 Obesity, unspecified: Secondary | ICD-10-CM | POA: Insufficient documentation

## 2017-09-09 NOTE — Patient Instructions (Signed)
I sent in referral to DM and nutrition management. I will call when I see the blood work from your GYN Here are A1C levels

## 2017-09-10 NOTE — Progress Notes (Signed)
   Subjective:    Patient ID: Kellie [name], female    DOB: 08-29-1976, 41 y.o.   MRN: [id number]  HPI  Annual physical for [employer] employee.  Has had flu shot.  Recently seen by Ob gyn and had pap smear and labs including cholesterol.  She is non smoker, non drinker, no drugs.  Lives with husband and two children.   Her only real at risk behavior is obesity.  Previously had success with DM and nutrition managemnet classes for weight loss.  Is not active.  Does have some knee pain which limits her ability to walk.   Only HPDP is that she needs one time HIV screen - no at risk behavior.  Will get labs from [place] and defer until the next time I draw blood.      Review of Systems No headaches, visual change, persistant ST or hoarsensess, cough, SOB, CP, palpitations, nausea, abd pain, appetitie change, stool change bleeding, skin changes or focal numbness/weakness.     Objective:   Physical ExamHEENT normal Neck supple without thyromegally Lungs clear Cardiac RRR without m or g Abd benign Ext wnl Neuro normal gait motor and sensory.        Assessment & Plan:

## 2017-09-10 NOTE — Assessment & Plan Note (Signed)
Discussed lifestyle interventions.  She would like to go back to DM and nutrition management.

## 2017-09-10 NOTE — Assessment & Plan Note (Signed)
Healthy woman with only risk factor or morbid obesity.  Esp important since strong fhx of DM, HBP and cardiac problems.

## 2017-10-26 ENCOUNTER — Encounter: Payer: 59 | Attending: Family Medicine | Admitting: Registered"

## 2017-10-26 ENCOUNTER — Encounter: Payer: Self-pay | Admitting: Registered"

## 2017-10-26 DIAGNOSIS — Z713 Dietary counseling and surveillance: Secondary | ICD-10-CM | POA: Insufficient documentation

## 2017-10-26 NOTE — Progress Notes (Addendum)
Medical Nutrition Therapy:  Appt start time: 1600 end time:  1700.  Assessment:  Primary concerns today: Pt states she has been successful in losing weight previously working with a dietitian at [employer] and feels she has the knowledge and having accountability will help keep her on track.    Pt reports that she doesn't drink water because of limited bathroom breaks. Pt states her goal is to drink 64 oz water and has worked out a solution with her supervisor for bathroom breaks.  Hormones: Pt states she has had thyroid tests and it is fine and has had PCOS ruled out. Sleep: okay. About 6 hrs.  Stress: very busy life with work and family.  Pt states the reason she is skipping breakfast is because she stays in bed and watches news in the morning. Pt states her husband does the grocery shopping and cooking.  In addition to weight, patient agreed to monitor her energy level around 1 pm. Today patient rates 1 pm average energy at a 3 on a scale of 1-10 (low to high)  Pt reports that checking in about every 4 weeks was helpful in the past and would like to try that again.  TANITA  BODY COMP RESULTS  272.0   BMI (kg/m^2) 42.6   Fat Mass (lbs) 146.6   Fat Free Mass (lbs) 125.4   Total Body Water (lbs) 93.0   Preferred Learning Style:   No preference indicated   Learning Readiness:   Ready  MEDICATIONS: none   DIETARY INTAKE:  Usual eating pattern includes 2-3 meals and 1 snacks per day.  24-hr recall:  B ( AM): none OR biscuit at work if time Snk ( AM): none  L ( PM): chicken noodle soup and cornbread OR Snk ( PM): none D ( PM): spaghetti (because it is easy) OR meat (steak, shrimp, salmon), something green & starch Snk ( PM): wine, cheese OR chips Beverages: coffee occasionally, sweet tea, little water  Usual physical activity: ADLs;   Estimated energy needs: 1800 calories 200 g carbohydrates 113 g protein 60 g fat  Progress Towards Goal(s):  In progress.   Nutritional  Diagnosis:  NI-5.8.5 Inadeqate fiber intake As related to minimal vegetables and whole grains.  As evidenced by dietary recall.    Intervention:  Nutrition Education. Described balanced eating and purpose of snacks. Discussed mindful eating and role in getting enough nutrition. Discussed ways to monitor success in improving health with lifestyle changes.  Plan:   Stress: when life slows down look into meditiation if interested: free online course: [email or web address]   Sleep: read through handout and try to get more restful sleep   Breakfast - return to the habit you previously had  Snacks - use this opportunity to get in lots of veggies  Consider mindful eating and learn to use hunger/fullness   Handful of nuts daily  Fish 2-3 x/ week   Include beans in diet on a regular basis    Keep track of your energy level around 1 pm   Aim to get movement in most days min 10 min   Make a grocery list for husband  Teaching Method Utilized:  Visual Auditory  Handouts given during visit include:  My Plate Planner  Fish Guide  Sleep Hygiene  Barriers to learning/adherence to lifestyle change: none  Demonstrated degree of understanding via:  Teach Back   Monitoring/Evaluation:  Dietary intake, exercise, and body weight in 4 week(s).

## 2017-10-26 NOTE — Patient Instructions (Addendum)
Stress: when life slows down look into meditiation if interested: free online course: [email or web address]  Sleep: read through handout and try to get more restful sleep  Breakfast - return to the habit you previously had Snacks - use this opportunity to get in lots of veggies Consider mindful eating and learn to use hunger/fullness  Handful of nuts daily Fish 2-3 x/ week  Include beans in diet on a regular basis   Keep track of your energy level around 1 pm  Aim to get movement in most days min 10 min  Make a grocery list for husband

## 2017-11-16 ENCOUNTER — Encounter: Payer: Self-pay | Admitting: Family Medicine

## 2017-11-26 ENCOUNTER — Encounter: Payer: 59 | Attending: Family Medicine | Admitting: Registered"

## 2017-11-26 DIAGNOSIS — Z713 Dietary counseling and surveillance: Secondary | ICD-10-CM | POA: Insufficient documentation

## 2017-11-26 NOTE — Progress Notes (Signed)
Medical Nutrition Therapy:  Appt start time: 1550 end time:  1605.  Follow-up Assessment:  Patient returns with weight loss of 11 lbs. Patient states she has has some extreme stress and was not able to eat much for awhile which may have caused the weight loss. Patient states after Monday (test will be over) she plans to keep a food diary and get on a regular eating and exercise schedule. Pt reports that she no longer drinks sweet tea and is drinking much more water now.  Pt states she has started having breakfast more regularly and reports hard boiled eggs are easy and convenient, sometimes will eat on way to work. Patient states she has increased fish intake.  In addition to weight, patient agreed to monitor her energy level around 1 pm. Today patient rates 1 pm average energy at a 5-6 on a scale of 1-10 (low to high) Patient states this increase (up from 3) in energy may be due to adrenaline with all that she has going on.  With increased energy patient states more motivation to move more and started taking stairs until her knee started bothering her again. Pt reports see an othro doctor who has given her things to do for the knee pain.  TANITA  BODY COMP RESULTS  272.0 261.0   BMI (kg/m^2) 42.6 40.9   Fat Mass (lbs) 146.6 138.8   Fat Free Mass (lbs) 125.4 122.2   Total Body Water (lbs) 93.0 90.2   Preferred Learning Style:   No preference indicated   Learning Readiness:   Ready  MEDICATIONS: none   DIETARY INTAKE:  Usual eating pattern includes 2-3 meals and 1 snacks per day.  24-hr recall:  B ( AM): overnight oats OR yogurt, fruit, nuts.  Snk ( AM): none  L ( PM): chicken and salad or some lean protein and vegetables Snk ( PM): fruit, or chips (has been looking for healthier options), nuts D ( PM): lean protein, 2 vegetables Snk ( PM): wine, cheese OR chips Beverages: coffee occasionally, water  Usual physical activity: ADLs;   Estimated energy needs: 1800 calories 200  g carbohydrates 113 g protein 60 g fat  Progress Towards Goal(s):  In progress.   Nutritional Diagnosis:  NI-5.8.5 Inadeqate fiber intake As related to minimal vegetables and whole grains.  As evidenced by dietary recall.    Intervention:  Nutrition Education. Described balanced eating and purpose of snacks. Discussed mindful eating and role in getting enough nutrition. Discussed ways to monitor success in improving health with lifestyle changes.  Plan:   Stress: when life slows down look into meditiation if interested: free online course: [email or web address]  Breakfast - return to the habit you previously had  Snacks - use this opportunity to get in lots of veggies  Consider mindful eating and learn to use hunger/fullness  Handful of nuts daily  Fish 2-3 x/ week   Include beans in diet on a regular basis   Keep track of your energy level around 1 pm  Aim to get movement in most days min 10 min  Make a grocery list for husband  Teaching Method Utilized:  Visual Auditory  Handouts given during visit include:  none  Barriers to learning/adherence to lifestyle change: none  Demonstrated degree of understanding via:  Teach Back   Monitoring/Evaluation:  Dietary intake, exercise, and body weight in 6 week(s).

## 2017-11-26 NOTE — Patient Instructions (Signed)
Plan:   Stress: when life slows down look into meditiation if interested: free online course: [email or web address]  Breakfast - return to the habit you previously had  Snacks - use this opportunity to get in lots of veggies  Consider mindful eating and learn to use hunger/fullness  Handful of nuts daily  Fish 2-3 x/ week   Include beans in diet on a regular basis   Keep track of your energy level around 1 pm  Aim to get movement in most days min 10 min  Make a grocery list for husband

## 2018-01-01 ENCOUNTER — Encounter: Payer: Self-pay | Admitting: Family Medicine

## 2018-01-05 ENCOUNTER — Encounter: Payer: 59 | Attending: Family Medicine | Admitting: Registered"

## 2018-01-05 DIAGNOSIS — Z713 Dietary counseling and surveillance: Secondary | ICD-10-CM | POA: Diagnosis not present

## 2018-01-05 NOTE — Progress Notes (Signed)
Medical Nutrition Therapy:  Appt start time:  0240 end time:  1615.  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 Pt states she also has new life schedule due to husband's new job that takes him out of town. Patient now needs to take on more cooking responsibility and taking care of kids.  Patient returns with weight gain of 5 lbs and feels it is because she is not drinking as much water as she had been. Patient states she has been paying attention to her energy level and finds that when she doesn't skip meals she has more energy, no energy slump at 1 pm and is not exhausted at night.   TANITA  BODY COMP RESULTS    01/05/2018   272.0 261.0 266.2   BMI (kg/m^2) 42.6 40.9 41.7   Fat Mass (lbs) 146.6 138.8 139.8   Fat Free Mass (lbs) 125.4 122.2 126.4   Total Body Water (lbs) 93.0 90.2 93.6   Preferred Learning Style:   No preference indicated   Learning Readiness:   Ready  MEDICATIONS: none   DIETARY INTAKE:  Usual eating pattern includes 2-3 meals and 1 snacks per day.  24-hr recall:  B ( AM): none   Snk ( AM): none OR PB crackers L ( PM): food truck, shrimp & fries, mountain dew Snk ( PM): none D ( PM): chips out of vending machine last night. Planning on steak and broccoli, rice Snk ( PM): wine, cheese OR chips Beverages: goal of 64 oz water, only 1 glass yesterday, coffee occasionally, mt dew  Usual physical activity: ADLs;   Estimated energy needs: 1800 calories 200 g carbohydrates 113 g protein 60 g fat  Progress Towards Goal(s):  In progress.   Nutritional Diagnosis:  NI-5.8.5 Inadeqate fiber intake As related to minimal vegetables and whole grains.  As evidenced by dietary recall.    Intervention:  Nutrition Education. Discussed ways to monitor success in improving health with lifestyle changes.  Discussed setting realistic goals and small changes for lifestyle improvement.  Plan: (progress since last visit)  Stress: when life slows down look into meditiation if interested: free online course: [email or web address]  Breakfast - return to the habit you previously had  Snacks - use this opportunity to get in lots of veggies (finding new things to try to replace chip craving)  Consider mindful eating and learn to use hunger/fullness (great job on paying attention)  Handful of nuts daily (meeting)  Fish 2-3 x/ week (not every week)  Include beans in diet on a regular basis (found bean dip, like a salsa)  Keep track of your energy level around 1 pm ( is great when she has breakfast and snacks)  Aim to get movement in most days min 10 min (30% of the time)  Teaching Method Utilized:  Visual Auditory  Handouts given during visit include:  none  Barriers to learning/adherence to lifestyle change: none  Demonstrated degree of understanding via:  Teach Back   Monitoring/Evaluation:  Dietary intake, exercise, and body weight prn.

## 2018-01-05 NOTE — Patient Instructions (Signed)
Plan: (progress since last visit)  Stress: when life slows down look into meditiation if interested: free online course: [email or web address]  Breakfast - return to the habit you previously had  Snacks - use this opportunity to get in lots of veggies (finding new things to try to replace chip craving)  Consider mindful eating and learn to use hunger/fullness (great job on paying attention)  Handful of nuts daily (meeting)  Fish 2-3 x/ week (not every week)  Include beans in diet on a regular basis (found bean dip, like a salsa)  Keep track of your energy level around 1 pm ( is great when she has breakfast and snacks)  Aim to get movement in most days min 10 min (30% of the time)

## 2018-03-15 ENCOUNTER — Ambulatory Visit (INDEPENDENT_AMBULATORY_CARE_PROVIDER_SITE_OTHER): Payer: 59 | Admitting: Internal Medicine

## 2018-03-15 ENCOUNTER — Encounter: Payer: Self-pay | Admitting: Internal Medicine

## 2018-03-15 VITALS — BP 126/85 | HR 95 | Temp 99.2°F | Ht 67.0 in | Wt 268.0 lb

## 2018-03-15 DIAGNOSIS — Z5181 Encounter for therapeutic drug level monitoring: Secondary | ICD-10-CM

## 2018-03-15 DIAGNOSIS — B351 Tinea unguium: Secondary | ICD-10-CM | POA: Diagnosis not present

## 2018-03-15 MED ORDER — TERBINAFINE HCL 250 MG PO TABS: 250.0000 mg | ORAL_TABLET | Freq: Every day | ORAL | 2 refills | Status: DC

## 2018-03-15 MED FILL — TERBINAFINE HCL 250 MG TABS: 250 | 30 days supply | Qty: 30 | Fill #0

## 2018-03-15 NOTE — Assessment & Plan Note (Signed)
-  New pain without evidence of cellulitis or abscess.  3 toenails involved. -Discussed treatment options including topical, systemic, and toenail removal.  Patient prefers trying systemic therapy again. -Ordered terbinafine 250 mg daily with expected course of 12 weeks.  Ordered CMP for baseline today.  Recommended return lab visit in several weeks for LFT recheck. -Recommended Epson salt soaks for comfort. -Suggested seeing her podiatrist to trim right great toenail such that it is more likely to grow back straight.

## 2018-03-15 NOTE — Progress Notes (Signed)
[facility] Progress Note  Subjective:  Kellie [name] is a 42 y.o. female with history of thyromegaly and obesity who presents for painful right great toe.  She reports history of toenail fungus of this toe in the past with partial treatment with Lamisil due to not completing required LFT monitoring through podiatry.  Pain began about a month ago after getting a pedicure.  It is painful for her to wear tennis shoes at work.  She has not tried foot soaks.  She reports thickened toenails of right fifth toe and left great toe as well.  However these are not painful.  She denies drainage from the toe.  ROS: No fevers, no rashes    Allergies  Allergen Reactions  . Morphine And Related Itching    Social History   Tobacco Use  . Smoking status: Never Smoker  . Smokeless tobacco: Never Used  Substance Use Topics  . Alcohol use: No    Objective: Blood pressure 126/85, pulse 95, temperature 99.2 F (37.3 C), height 5\' 7"  (1.702 m), weight 268 lb (121.6 kg). Body mass index is 41.97 kg/m. Constitutional: Well-appearing female, in no apparent distress Musculoskeletal: No lower extremity edema.  Skin: Thickened toenail of right helix with raising of the nail distally.  TTP over toenail but no paronychia present.  No surrounding redness.  Thickened but flat right fifth toe. Psychiatric: Normal mood and affect.  Vitals reviewed  AST, ALT were 17, 14 respectively spring of last year.  Assessment/Plan: Onychomycosis -New pain without evidence of cellulitis or abscess.  3 toenails involved. -Discussed treatment options including topical, systemic, and toenail removal.  Patient prefers trying systemic therapy again. -Ordered terbinafine 250 mg daily with expected course of 12 weeks.  Ordered CMP for baseline today.  Recommended return lab visit in several weeks for LFT recheck. -Recommended Epson salt soaks for comfort. -Suggested seeing her podiatrist to trim right great toenail  such that it is more likely to grow back straight.  Follow-up in 4-6 weeks for lab visit to check LFTs.  Olene Floss, MD [place], PGY-3

## 2018-03-15 NOTE — Patient Instructions (Signed)
Kellie [name],  Please take terbinafine (lamisil) daily for the next 2-3 months. Please return in 4-6 weeks for lab check. You can go ahead and make this appointment, as the order is in.   I would recommend epsom salt baths for comfort. You may want to see a podiatrist to clip the right big toenail to help with it growing in straight.  Best, Dr. Ola Spurr

## 2018-05-21 MED FILL — TERBINAFINE HCL 250 MG TABS: 250 | 30 days supply | Qty: 30 | Fill #1

## 2018-06-19 IMAGING — MR MR KNEE*R* W/O CM
4 of 6 series · 19 of 40 positions shown · non-contrast
Comparison: None.

CLINICAL DATA: Anterior right knee pain and swelling status post
fall 2 weeks ago.

EXAM:
MRI OF THE RIGHT KNEE WITHOUT CONTRAST
TECHNIQUE: Multiplanar, multisequence MR imaging of the knee was performed. No
intravenous contrast was administered.

[Series 4: PD fat-sat · axial · 3.0mm · 0.31mm/px · z∈[-55,+74]mm · 8 of 38 slices shown (1 of 4)]
[im 1/38]
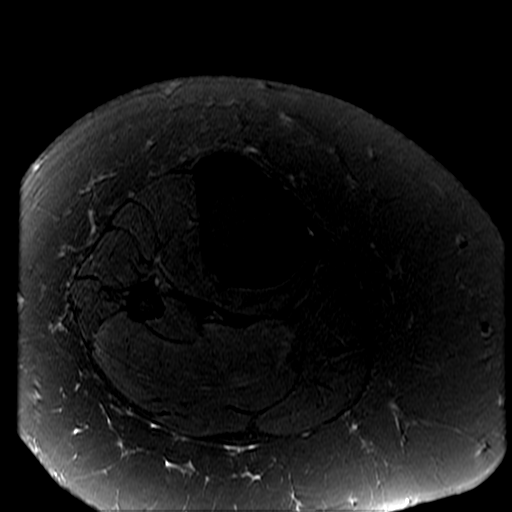
[im 6/38]
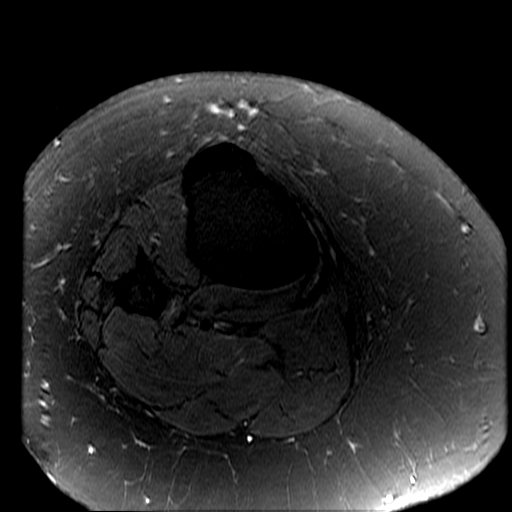
[im 11/38]
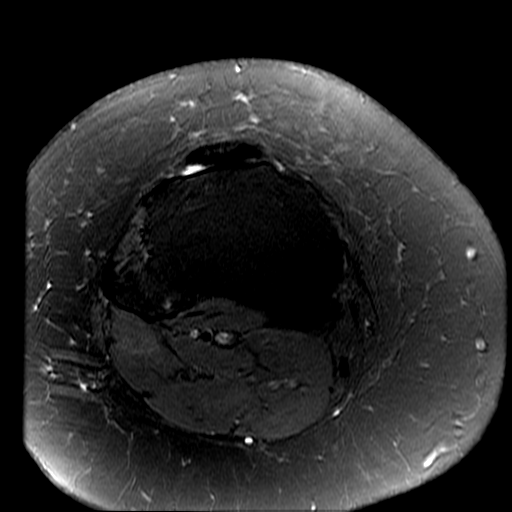
[im 16/38]
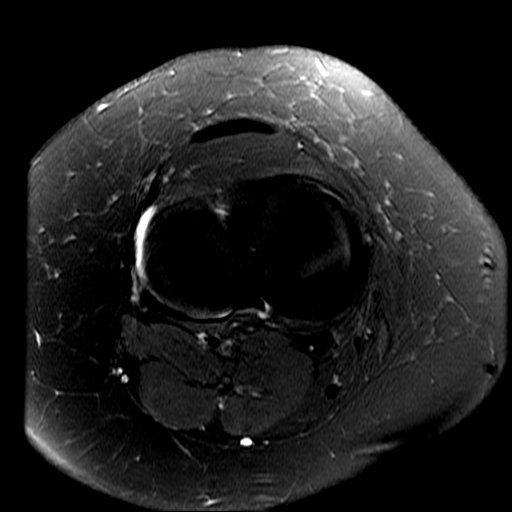
[im 22/38]
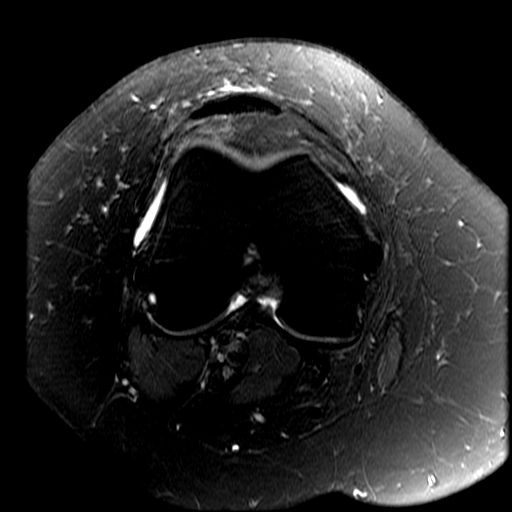
[im 27/38]
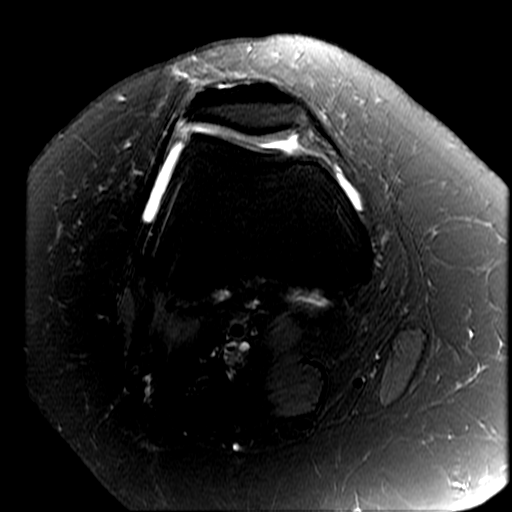
[im 32/38]
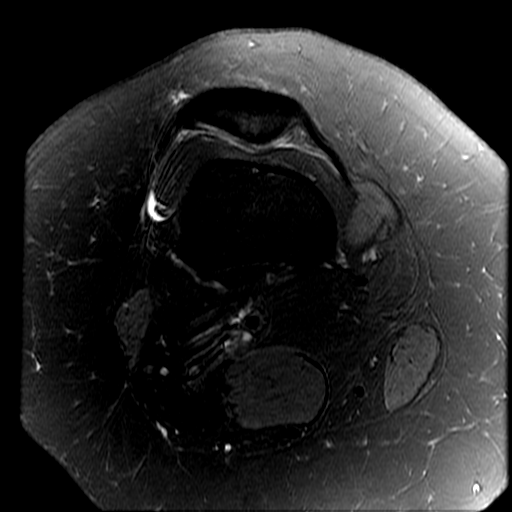
[im 38/38]
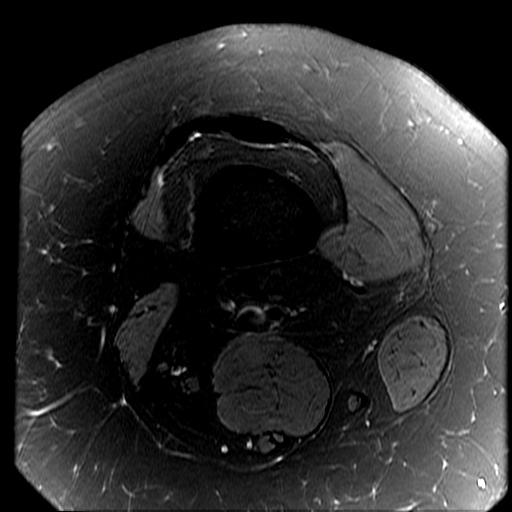

[Series 7: PD fat-sat · sagittal · 3.0mm · 0.29mm/px · 5 of 32 slices shown (2 of 4)]
[im 1/32]
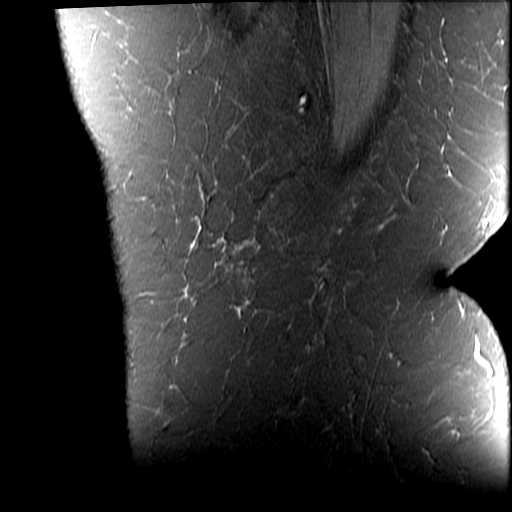
[im 6/32]
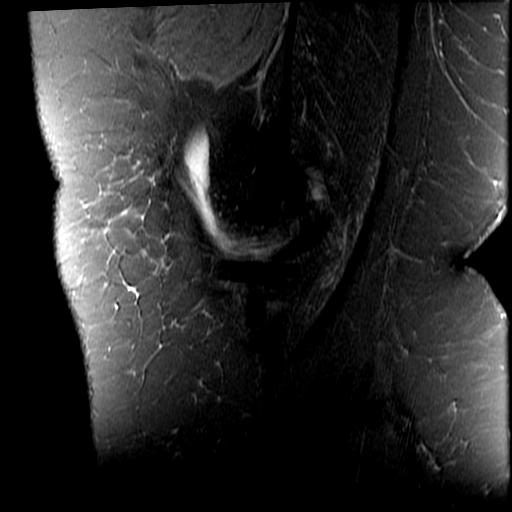
[im 11/32]
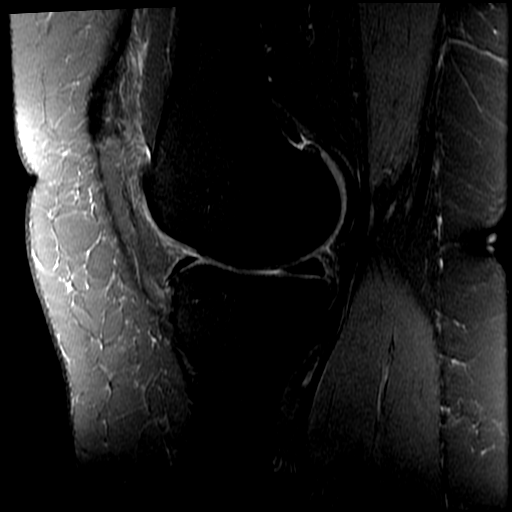
[im 16/32]
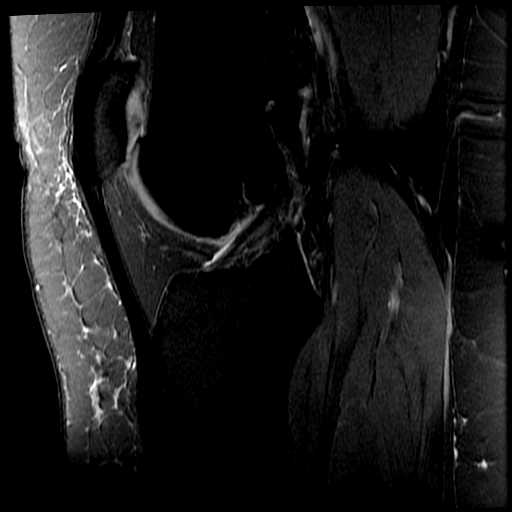
[im 26/32]
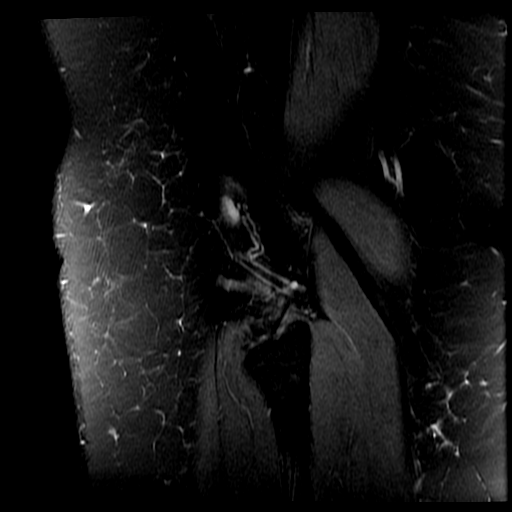

[Series 8: PD fat-sat · coronal · 3.0mm · 0.31mm/px · 3 of 34 slices shown (3 of 4)]
[im 6/34]
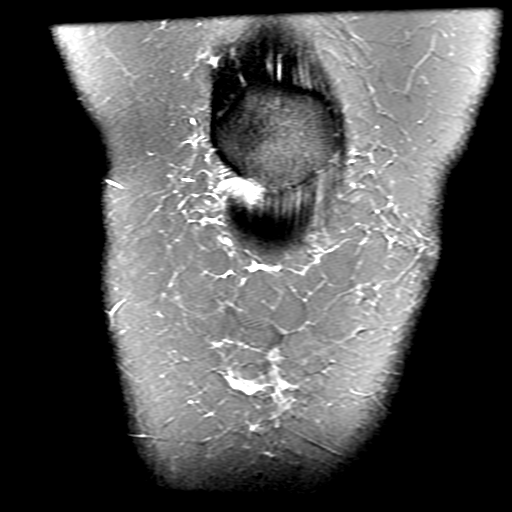
[im 17/34]
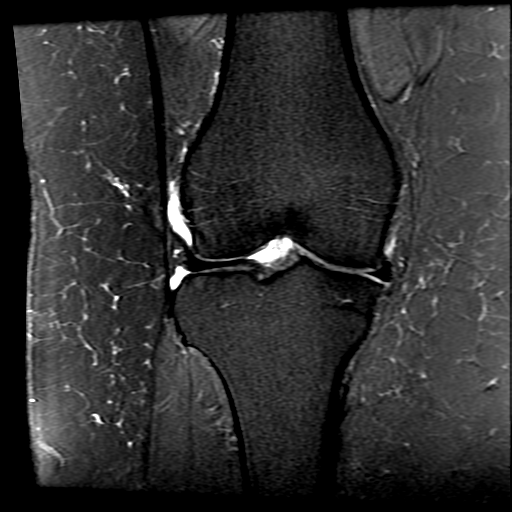
[im 28/34]
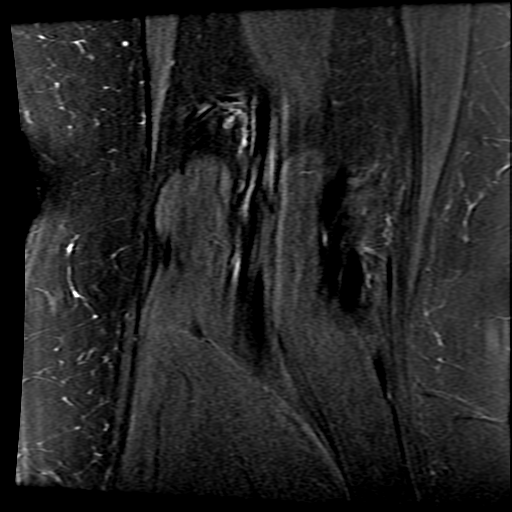

[Series 10: PD fat-sat · oblique · 2.0mm · 0.35mm/px · 3 of 20 slices shown (4 of 4)]
[im 1/20]
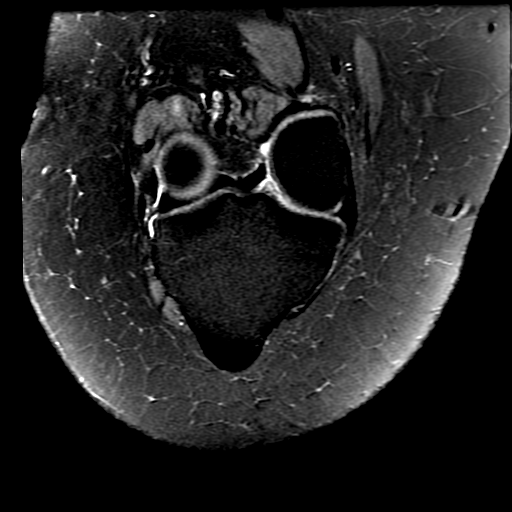
[im 13/20]
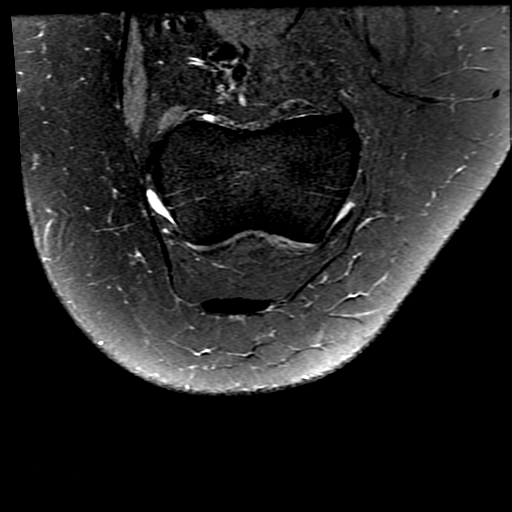
[im 20/20]
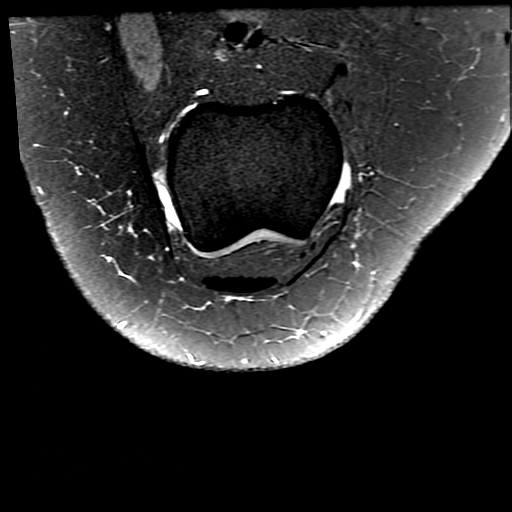

[19 of 40 positions shown; findings below may reference images not displayed]

FINDINGS: MENISCI

Medial meniscus:  Intact.

Lateral meniscus:  Intact.

LIGAMENTS

Cruciates:  Intact ACL and PCL.

Collaterals: Medial collateral ligament is intact. Lateral
collateral ligament complex is intact.

CARTILAGE

Patellofemoral:  No chondral defect.

Medial: Mild partial-thickness cartilage loss of the medial
femorotibial compartment.

Lateral:  No chondral defect.

Joint: No joint effusion. Edema in superolateral Hoffa's fat. No
plical thickening.

Popliteal Fossa:  No Baker cyst.  Intact popliteus tendon.

Extensor Mechanism:  Intact quadriceps tendon and patellar tendon.

Bones: No focal marrow signal abnormality. No fracture or
dislocation.

Other: None
IMPRESSION: 1. Edema in superolateral Hoffa's fat which may secondary to direct
trauma versus patellar tendon-lateral femoral condyle friction
syndrome.
2. Mild partial-thickness cartilage loss of the medial femorotibial
compartment.

## 2018-06-28 DIAGNOSIS — H1013 Acute atopic conjunctivitis, bilateral: Secondary | ICD-10-CM | POA: Diagnosis not present

## 2018-07-16 DIAGNOSIS — Z1231 Encounter for screening mammogram for malignant neoplasm of breast: Secondary | ICD-10-CM | POA: Diagnosis not present

## 2018-10-15 DIAGNOSIS — Z0100 Encounter for examination of eyes and vision without abnormal findings: Secondary | ICD-10-CM | POA: Diagnosis not present

## 2019-09-08 ENCOUNTER — Ambulatory Visit: Payer: Self-pay

## 2019-09-08 ENCOUNTER — Other Ambulatory Visit: Payer: Self-pay

## 2019-09-08 VITALS — BP 102/70 | HR 94 | Wt 250.0 lb

## 2019-09-08 DIAGNOSIS — R5383 Other fatigue: Secondary | ICD-10-CM

## 2019-09-08 DIAGNOSIS — R232 Flushing: Secondary | ICD-10-CM

## 2019-09-08 NOTE — Patient Instructions (Signed)
It was a pleasure to see you today! Thank you for choosing [facility] for your primary care. Kellie [name] was seen for fatigue and feeling of hot flashes. Come back to the clinic to see Dr. Andria Frames on the 14th.  Today we talked about your complaint of fatigue and occasionally getting hot.  I do not think it is likely that you are having hot flashes given that you only get hot when you are walking around outside using a mask.  We are going to go ahead and check some labs to verify some of the chemical reasons that might cause fatigue.  After talking to getting her story it does seem that it is likely that your desire to calorie restrict well with increasing her exercise dramatically might have her body essentially in a starvation state which costs you some energy.   Please calculate your daily calorie burn and start tracking her calories that you eat.  You should be aiming for deficit somewhere in the range of 502,000 cal at most.  Would like you to keep your appointment with Dr. Andria Frames on the 14th to go over that and these blood labs to see if adjusting your calorie intake to your exercise helps out with your energy.   Please bring all your medications to every doctors visit   Sign up for My Chart to have easy access to your labs results, and communication with your Primary care physician.     Please check-out at the front desk before leaving the clinic.     Best,  Dr. Sherene Sires FAMILY MEDICINE RESIDENT - PGY3 09/08/2019 4:30 PM

## 2019-09-08 NOTE — Progress Notes (Unsigned)
    Subjective:  Kellie [name] is a 43 y.o. female who presents to the [facility] today with a chief complaint of ***.   HPI:   ***HIST  Objective:  Physical Exam: BP 102/70   Pulse 94   Wt 250 lb (113.4 kg)   LMP 08/15/2019 (Approximate)   SpO2 97%   BMI 39.16 kg/m   Gen: ***NAD, resting comfortably CV: RRR with no murmurs appreciated Pulm: NWOB, CTAB with no crackles, wheezes, or rhonchi GI: Normal bowel sounds present. Soft, Nontender, Nondistended. MSK: no edema, cyanosis, or clubbing noted Skin: warm, dry Neuro: grossly normal, moves all extremities Psych: Normal affect and thought content  No results found for this or any previous visit (from the past 72 hour(s)).   Assessment/Plan:  No problem-specific Assessment & Plan notes found for this encounter.   Sherene Sires, DO FAMILY MEDICINE RESIDENT - PGY3 09/08/2019 4:22 PM

## 2019-09-09 ENCOUNTER — Encounter: Payer: Self-pay | Admitting: Family Medicine

## 2019-09-09 LAB — TSH: TSH: 1.2 u[IU]/mL (ref 0.450–4.500)

## 2019-09-09 LAB — BASIC METABOLIC PANEL
BUN/Creatinine Ratio: 22 (ref 9–23)
BUN: 14 mg/dL (ref 6–24)
CO2: 24 mmol/L (ref 20–29)
Calcium: 9.9 mg/dL (ref 8.7–10.2)
Chloride: 100 mmol/L (ref 96–106)
Creatinine, Ser: 0.65 mg/dL (ref 0.57–1.00)
GFR calc Af Amer: 125 mL/min/{1.73_m2} (ref >59)
GFR calc non Af Amer: 108 mL/min/{1.73_m2} (ref >59)
Glucose: 97 mg/dL (ref 65–99)
Potassium: 4.1 mmol/L (ref 3.5–5.2)
Sodium: 136 mmol/L (ref 134–144)

## 2019-09-09 LAB — CBC
Hematocrit: 36 % (ref 34.0–46.6)
Hemoglobin: 11.4 g/dL (ref 11.1–15.9)
MCH: 27.5 pg (ref 26.6–33.0)
MCHC: 31.7 g/dL (ref 31.5–35.7)
MCV: 87 fL (ref 79–97)
Platelets: 317 10*3/uL (ref 150–450)
RBC: 4.15 x10E6/uL (ref 3.77–5.28)
RDW: 12.8 % (ref 11.7–15.4)
WBC: 3.7 10*3/uL (ref 3.4–10.8)

## 2019-09-09 LAB — FOLLICLE STIMULATING HORMONE: FSH: 4.1 m[IU]/mL

## 2019-09-14 ENCOUNTER — Encounter: Payer: Self-pay | Admitting: Family Medicine

## 2019-09-14 ENCOUNTER — Other Ambulatory Visit: Payer: Self-pay

## 2019-09-14 ENCOUNTER — Ambulatory Visit (INDEPENDENT_AMBULATORY_CARE_PROVIDER_SITE_OTHER): Payer: Self-pay | Admitting: Family Medicine

## 2019-09-14 DIAGNOSIS — Z13220 Encounter for screening for lipoid disorders: Secondary | ICD-10-CM

## 2019-09-14 DIAGNOSIS — E010 Iodine-deficiency related diffuse (endemic) goiter: Secondary | ICD-10-CM

## 2019-09-14 DIAGNOSIS — E669 Obesity, unspecified: Secondary | ICD-10-CM

## 2019-09-14 DIAGNOSIS — Z114 Encounter for screening for human immunodeficiency virus [HIV]: Secondary | ICD-10-CM | POA: Insufficient documentation

## 2019-09-14 DIAGNOSIS — Z0000 Encounter for general adult medical examination without abnormal findings: Secondary | ICD-10-CM

## 2019-09-14 DIAGNOSIS — Z8249 Family history of ischemic heart disease and other diseases of the circulatory system: Secondary | ICD-10-CM

## 2019-09-14 NOTE — Patient Instructions (Addendum)
For our records, please call when you get your flu shot at work and please have your gynecologist send me a copy of your pap smear.   I am delighted that you are so serious about diet and exercise.  With your family history of heart problems, it is the best thing you can do. I will call with the blood test results.  See me in one year if things are going well.

## 2019-09-15 ENCOUNTER — Encounter: Payer: Self-pay | Admitting: Family Medicine

## 2019-09-15 DIAGNOSIS — Z8249 Family history of ischemic heart disease and other diseases of the circulatory system: Secondary | ICD-10-CM | POA: Insufficient documentation

## 2019-09-15 NOTE — Progress Notes (Signed)
Established Patient Office Visit  Subjective:  Patient ID: Kellie [name], female    DOB: 1976/09/24  Age: 43 y.o. MRN: [id number]  CC:  Chief Complaint  Patient presents with  . Annual Exam    HPI Kellie [name] presents for annual exam.  Her main previous health problem has been morbid obesity.  She has made recent drastic changes (triggered by gaining additional wt during COVID.)  She is now eating quite healthy and walking 5 miles 3 days per weak.  She recently had to increase her calorie intake because she was having sx.  The change resolved her symptoms.  She has lost at least 18 lbs from her peak.  Other issues. 1. Due for Pap.  Has gynecologist. Has appoitment.  Also getting annual mammograms.  2. Due for flu shot.  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   3. We do not have records of a recent lipid panel and no record of HIV screen.   4. Main historical risk factor is strong family hx of CAD.    No past medical history on file.  Past Surgical History:  Procedure Laterality Date  . ABLATION    . CESAREAN SECTION      Family History  Problem Relation Age of Onset  . Lupus Mother   . Miscarriages / Stillbirths Maternal Grandfather   . Heart disease Maternal Grandfather   . Diabetes Paternal Grandmother   . Hypertension Paternal Grandmother     Social History   Socioeconomic History  . Marital status: Married    Spouse name: Not on file  . Number of children: Not on file  . Years of education: Not on file  . Highest education level: Not on file  Occupational History  . Not on file  Social Needs  . Financial resource strain: Not on file  . Food insecurity    Worry: Not on file    Inability: Not on file  . Transportation needs    Medical: Not on file    Non-medical: Not on file  Tobacco Use  . Smoking status: Never Smoker  . Smokeless tobacco: Never Used  Substance and Sexual Activity  . Alcohol use: No  . Drug use: No  . Sexual activity:  Not on file  Lifestyle  . Physical activity    Days per week: Not on file    Minutes per session: Not on file  . Stress: Not on file  Relationships  . Social Herbalist on phone: Not on file    Gets together: Not on file    Attends religious service: Not on file    Active member of club or organization: Not on file    Attends meetings of clubs or organizations: Not on file    Relationship status: Not on file  . Intimate partner violence    Fear of current or ex partner: Not on file    Emotionally abused: Not on file    Physically abused: Not on file    Forced sexual activity: Not on file  Other Topics Concern  . 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  Has a supportive husband and two kids (4 and 7)    Outpatient Medications Prior to Visit  Medication Sig Dispense Refill  . terbinafine (LAMISIL) 250 MG tablet Take 1 tablet (250 mg total) by mouth daily. 30 tablet 2   No facility-administered medications prior to  visit.     Allergies  Allergen Reactions  . Morphine And Related Itching    ROS Review of Systems   Denies CP, SOB, difficulty swallowing, indigestion, abd pain, leg swelling, weakness or numbness.  Denies change in HA pattern, vision, hearing, appetite, bowel or bladder.  No bleeding. Objective:    Physical Exam  BP 114/72   Pulse 89   Ht 5\' 7"  ([id number] m)   Wt 250 lb 12.8 oz (113.8 kg)   LMP 08/14/2019 (Exact Date)   SpO2 98%   BMI 39.28 kg/m  Wt Readings from Last 3 Encounters:  09/14/19 250 lb 12.8 oz (113.8 kg)  09/08/19 250 lb (113.4 kg)  03/15/18 268 lb (121.6 kg)   HEENT WNL Neck supple without masses. Lungs clear Cardiac RRR without m or g abd benign Ext wnl Neuro Motor sensory and gait grossly intact.  Health Maintenance Due  Topic Date Due  . HIV Screening  03/02/1990  . INFLUENZA VACCINE  07/02/2019    There are no preventive care reminders to display for this patient.  Lab Results   Component Value Date   TSH 1.200 09/08/2019   Lab Results  Component Value Date   WBC 3.7 09/08/2019   HGB 11.4 09/08/2019   HCT 36.0 09/08/2019   MCV 87 09/08/2019   PLT 317 09/08/2019   Lab Results  Component Value Date   NA 136 09/08/2019   K 4.1 09/08/2019   CO2 24 09/08/2019   GLUCOSE 97 09/08/2019   BUN 14 09/08/2019   CREATININE 0.65 09/08/2019   BILITOT 0.5 06/10/2013   ALKPHOS 62 06/10/2013   AST 17 06/10/2013   ALT 14 06/10/2013   PROT 7.4 06/10/2013   ALBUMIN 4.3 06/10/2013   CALCIUM 9.9 09/08/2019   Lab Results  Component Value Date   CHOL 157 02/10/2014   Lab Results  Component Value Date   HDL 58 02/10/2014   Lab Results  Component Value Date   LDLCALC 90 02/10/2014   Lab Results  Component Value Date   TRIG 45 02/10/2014   Lab Results  Component Value Date   CHOLHDL 2.7 02/10/2014   No results found for: HGBA1C    Assessment & Plan:   Problem List Items Addressed This Visit    Screening cholesterol level   Relevant Orders   Lipid panel   Screening for HIV without presence of risk factors   Relevant Orders   HIV Antibody (routine testing w rflx)      No orders of the defined types were placed in this encounter.   Follow-up: No follow-ups on file.    Zenia Resides, MD

## 2019-09-15 NOTE — Assessment & Plan Note (Signed)
No longer qualifies as morbid obese.  Encouraged continued diet and exercise.

## 2019-09-15 NOTE — Assessment & Plan Note (Signed)
Repeat lipids.  Emphasize continued wt loss.

## 2019-09-15 NOTE — Assessment & Plan Note (Signed)
Healthy female whose only reversible risk factor is obesity.  She is working on it diligently.   FHx of premature CAD.

## 2019-09-15 NOTE — Assessment & Plan Note (Signed)
Never screened.

## 2019-09-15 NOTE — Assessment & Plan Note (Signed)
Recent normal TSH.

## 2019-09-15 NOTE — Assessment & Plan Note (Signed)
Due for screening 

## 2019-09-23 ENCOUNTER — Other Ambulatory Visit: Payer: Self-pay

## 2019-09-23 ENCOUNTER — Other Ambulatory Visit: Payer: No Typology Code available for payment source

## 2019-09-23 DIAGNOSIS — Z114 Encounter for screening for human immunodeficiency virus [HIV]: Secondary | ICD-10-CM

## 2019-09-23 DIAGNOSIS — Z13220 Encounter for screening for lipoid disorders: Secondary | ICD-10-CM

## 2019-09-24 LAB — LIPID PANEL
Chol/HDL Ratio: 3.1 ratio (ref 0.0–4.4)
Cholesterol, Total: 144 mg/dL (ref 100–199)
HDL: 46 mg/dL (ref >39)
LDL Chol Calc (NIH): 86 mg/dL (ref 0–99)
Triglycerides: 55 mg/dL (ref 0–149)
VLDL Cholesterol Cal: 12 mg/dL (ref 5–40)

## 2019-09-24 LAB — HIV ANTIBODY (ROUTINE TESTING W REFLEX): HIV Screen 4th Generation wRfx: NONREACTIVE

## 2020-06-26 ENCOUNTER — Ambulatory Visit: Payer: No Typology Code available for payment source | Attending: Critical Care Medicine

## 2020-06-26 DIAGNOSIS — Z23 Encounter for immunization: Secondary | ICD-10-CM

## 2020-06-26 NOTE — Progress Notes (Signed)
   Covid-19 Vaccination Clinic  Name:  VADIS SLABACH    MRN: [id number] DOB: 02-12-1976  06/26/2020  Ms. Stehr was observed post Covid-19 immunization for 15 minutes without incident. She was provided with Vaccine Information Sheet and instruction to access the V-Safe system.   Ms. Montella was instructed to call 911 with any severe reactions post vaccine: Marland Kitchen Difficulty breathing  . Swelling of face and throat  . A fast heartbeat  . A bad rash all over body  . Dizziness and weakness   Immunizations Administered    Name Date Dose VIS Date Route   Pfizer COVID-19 Vaccine 06/26/2020 12:34 PM 0.3 mL 01/25/2019 Intramuscular   Manufacturer: [place]   Lot: [id number]   [place]: [id number]

## 2020-07-17 ENCOUNTER — Ambulatory Visit: Payer: No Typology Code available for payment source | Attending: Internal Medicine

## 2020-07-17 DIAGNOSIS — Z23 Encounter for immunization: Secondary | ICD-10-CM

## 2020-07-17 NOTE — Progress Notes (Signed)
   Covid-19 Vaccination Clinic  Name:  Kellie [name]    MRN: [id number] DOB: 1976/03/02  07/17/2020  Kellie [name] was observed post Covid-19 immunization for 15 minutes without incident. She was provided with Vaccine Information Sheet and instruction to access the V-Safe system.   Kellie [name] was instructed to call 911 with any severe reactions post vaccine: Marland Kitchen Difficulty breathing  . Swelling of face and throat  . A fast heartbeat  . A bad rash all over body  . Dizziness and weakness   Immunizations Administered    Name Date Dose VIS Date Route   Pfizer COVID-19 Vaccine 07/17/2020 11:35 AM 0.3 mL 01/25/2019 Intramuscular   Manufacturer: [employer]   Lot: [id number]   [place]: [id number]

## 2020-12-10 ENCOUNTER — Ambulatory Visit (INDEPENDENT_AMBULATORY_CARE_PROVIDER_SITE_OTHER): Payer: No Typology Code available for payment source | Admitting: Family Medicine

## 2020-12-10 ENCOUNTER — Encounter: Payer: Self-pay | Admitting: Family Medicine

## 2020-12-10 ENCOUNTER — Other Ambulatory Visit: Payer: Self-pay

## 2020-12-10 ENCOUNTER — Ambulatory Visit (HOSPITAL_COMMUNITY)
Admission: RE | Admit: 2020-12-10 | Discharge: 2020-12-10 | Disposition: A | Discharge status: Home / Self Care | Payer: No Typology Code available for payment source | Source: Ambulatory Visit | Attending: Family Medicine | Admitting: Family Medicine

## 2020-12-10 DIAGNOSIS — Z8249 Family history of ischemic heart disease and other diseases of the circulatory system: Secondary | ICD-10-CM

## 2020-12-10 DIAGNOSIS — R82998 Other abnormal findings in urine: Secondary | ICD-10-CM | POA: Diagnosis not present

## 2020-12-10 DIAGNOSIS — Z0000 Encounter for general adult medical examination without abnormal findings: Secondary | ICD-10-CM

## 2020-12-10 DIAGNOSIS — I517 Cardiomegaly: Secondary | ICD-10-CM

## 2020-12-10 DIAGNOSIS — Z1211 Encounter for screening for malignant neoplasm of colon: Secondary | ICD-10-CM | POA: Insufficient documentation

## 2020-12-10 DIAGNOSIS — Z833 Family history of diabetes mellitus: Secondary | ICD-10-CM

## 2020-12-10 DIAGNOSIS — R000 Tachycardia, unspecified: Secondary | ICD-10-CM | POA: Diagnosis present

## 2020-12-10 DIAGNOSIS — Z1159 Encounter for screening for other viral diseases: Secondary | ICD-10-CM | POA: Diagnosis not present

## 2020-12-10 LAB — POCT UA - MICROSCOPIC ONLY

## 2020-12-10 LAB — POCT URINALYSIS DIP (MANUAL ENTRY)
Bilirubin, UA: NEGATIVE
Glucose, UA: NEGATIVE mg/dL
Ketones, POC UA: NEGATIVE mg/dL
Leukocytes, UA: NEGATIVE
Nitrite, UA: NEGATIVE
Protein Ur, POC: NEGATIVE mg/dL
Spec Grav, UA: >=1.03 — AB (ref 1.010–1.025)
Urobilinogen, UA: 0.2 E.U./dL
pH, UA: 5.5 (ref 5.0–8.0)

## 2020-12-10 NOTE — Assessment & Plan Note (Signed)
I don't think this is related to her tachy.  Check lipids.

## 2020-12-10 NOTE — Assessment & Plan Note (Signed)
Check x 1.

## 2020-12-10 NOTE — Patient Instructions (Addendum)
You will be due for a COVID booster in mid Feb Someone should call with the GI appointment I will call with blood test results.  Stay active Keep up the healthy lifestyle.

## 2020-12-10 NOTE — Assessment & Plan Note (Signed)
Refer for initial screening colonoscopy.

## 2020-12-10 NOTE — Assessment & Plan Note (Signed)
Screen with A1C

## 2020-12-10 NOTE — Assessment & Plan Note (Signed)
Check UA (negative) and creat.

## 2020-12-10 NOTE — Assessment & Plan Note (Signed)
Long differential from anxiety, to anemia, to hyperthyroidism to structural heart disease.  I obtained labs and an EKG.  Interestingly, the EKG suggests Left Atrial enlargement.  Await other labs.  May need an echo.

## 2020-12-10 NOTE — Progress Notes (Signed)
    SUBJECTIVE:   CHIEF COMPLAINT / HPI:   Annual exam:   Medical concerns: 1. Tachycardia.  This did not come out as a concern until I commented on her pulse.  States she routinely is tachy.  She has been actively exercising and states that she reaches her target heart rate during warm up.  Trainers will often check on her when they see her heart rate.  Denies CP, SOB, swelling, or being previously told she has any heart problem.  No EKG in my files.  Last CXR was normal but it was more than 10 years ago. 2. FHx of premature CAD.  Had lipids two years ago which were good.  She has a healthy lifestyle: the family members with CAD did not.  Denies CP 3. FHx of DM.  Her risk factor is obesity.  States she eats a generally healthy diet. 4. Foamy urine.  Aware that may mean proteinuria.  Denies urgency, frequency or dysuria.  Last Creat OK but was over one year abo. 5. HPDP: Aware that now recommend colon cancer screen beginning age 83.  She wants screened.  Never Hep C screened.  She has had one time HIV screen.  She has no at risk behaviors suggesting that she needs more frequent screen.  Never smoker.  Ave one drink per night.  Exercising. 6. Gets pap, mammo and pelvic done elsewhere.  Last mammo was 3 days ago.    OBJECTIVE:   BP 110/84   Pulse (!) 109   Ht 5\' 7"  (1.702 m)   Wt 255 lb 12.8 oz (116 kg)   LMP 11/21/2020 (Approximate)   SpO2 98%   BMI 40.06 kg/m   VS including pulse noted. HEENT WNL Neck supple without thyromegally Lungs clear Cardiac RRR without m or g Abd benign Ext no edema Neuro, motor, sensory, gait, affect and cognition all grossly normal.   ASSESSMENT/PLAN:   Routine general medical examination at a health care facility Healthy lifestyle with no at risk behaviors.  Tachycardia Long differential from anxiety, to anemia, to hyperthyroidism to structural heart disease.  I obtained labs and an EKG.  Interestingly, the EKG suggests Left Atrial enlargement.   Await other labs.  May need an echo.   Foamy urine Check UA (negative) and creat.  Family history of diabetes mellitus Screen with A1C  Colon cancer screening Refer for initial screening colonoscopy.   Encounter for hepatitis C screening test for low risk patient Check x 1.  Family history of premature CAD I don't think this is related to her tachy.  Check lipids.       Zenia Resides, MD [place]

## 2020-12-10 NOTE — Assessment & Plan Note (Signed)
Healthy lifestyle with no at risk behaviors.

## 2020-12-11 DIAGNOSIS — I517 Cardiomegaly: Secondary | ICD-10-CM | POA: Insufficient documentation

## 2020-12-11 NOTE — Assessment & Plan Note (Signed)
Called and informed EKG suggests left atrial enlargement.  Given this possibility and her unexplained tachycardia, will get echo.

## 2020-12-11 NOTE — Addendum Note (Signed)
Addended by: Zenia Resides on: 12/11/2020 10:08 AM   Modules accepted: Orders

## 2020-12-14 ENCOUNTER — Other Ambulatory Visit: Payer: No Typology Code available for payment source

## 2020-12-14 ENCOUNTER — Other Ambulatory Visit: Payer: Self-pay

## 2020-12-14 ENCOUNTER — Ambulatory Visit (HOSPITAL_COMMUNITY)
Admission: RE | Admit: 2020-12-14 | Discharge: 2020-12-14 | Disposition: A | Discharge status: Home / Self Care | Payer: No Typology Code available for payment source | Source: Ambulatory Visit | Attending: Family Medicine | Admitting: Family Medicine

## 2020-12-14 ENCOUNTER — Other Ambulatory Visit (INDEPENDENT_AMBULATORY_CARE_PROVIDER_SITE_OTHER): Payer: No Typology Code available for payment source | Admitting: Family Medicine

## 2020-12-14 DIAGNOSIS — Z833 Family history of diabetes mellitus: Secondary | ICD-10-CM | POA: Diagnosis not present

## 2020-12-14 DIAGNOSIS — I517 Cardiomegaly: Secondary | ICD-10-CM | POA: Diagnosis present

## 2020-12-14 DIAGNOSIS — R9431 Abnormal electrocardiogram [ECG] [EKG]: Secondary | ICD-10-CM

## 2020-12-14 LAB — POCT GLYCOSYLATED HEMOGLOBIN (HGB A1C): Hemoglobin A1C: 5 % (ref 4.0–5.6)

## 2020-12-14 LAB — ECHOCARDIOGRAM COMPLETE
Area-P 1/2: 5.31 cm2
S' Lateral: 2.5 cm

## 2020-12-14 NOTE — Progress Notes (Signed)
  Echocardiogram 2D Echocardiogram has been performed.  Kellie [name] 12/14/2020, 3:49 PM

## 2020-12-15 LAB — BASIC METABOLIC PANEL
BUN/Creatinine Ratio: 14 (ref 9–23)
BUN: 11 mg/dL (ref 6–24)
CO2: 24 mmol/L (ref 20–29)
Calcium: 10 mg/dL (ref 8.7–10.2)
Chloride: 99 mmol/L (ref 96–106)
Creatinine, Ser: 0.8 mg/dL (ref 0.57–1.00)
GFR calc Af Amer: 103 mL/min/{1.73_m2} (ref >59)
GFR calc non Af Amer: 89 mL/min/{1.73_m2} (ref >59)
Glucose: 89 mg/dL (ref 65–99)
Potassium: 4.1 mmol/L (ref 3.5–5.2)
Sodium: 138 mmol/L (ref 134–144)

## 2020-12-15 LAB — CBC
Hematocrit: 38 % (ref 34.0–46.6)
Hemoglobin: 12.3 g/dL (ref 11.1–15.9)
MCH: 29.4 pg (ref 26.6–33.0)
MCHC: 32.4 g/dL (ref 31.5–35.7)
MCV: 91 fL (ref 79–97)
Platelets: 310 10*3/uL (ref 150–450)
RBC: 4.18 x10E6/uL (ref 3.77–5.28)
RDW: 12.3 % (ref 11.7–15.4)
WBC: 3.9 10*3/uL (ref 3.4–10.8)

## 2020-12-15 LAB — LIPID PANEL
Chol/HDL Ratio: 3 ratio (ref 0.0–4.4)
Cholesterol, Total: 183 mg/dL (ref 100–199)
HDL: 62 mg/dL (ref >39)
LDL Chol Calc (NIH): 106 mg/dL — ABNORMAL HIGH (ref 0–99)
Triglycerides: 81 mg/dL (ref 0–149)
VLDL Cholesterol Cal: 15 mg/dL (ref 5–40)

## 2020-12-15 LAB — HEPATITIS C ANTIBODY: Hep C Virus Ab: <0.1 s/co ratio (ref 0.0–0.9)

## 2020-12-15 LAB — TSH: TSH: 0.659 u[IU]/mL (ref 0.450–4.500)

## 2021-02-08 ENCOUNTER — Other Ambulatory Visit (HOSPITAL_BASED_OUTPATIENT_CLINIC_OR_DEPARTMENT_OTHER): Payer: Self-pay

## 2021-02-08 ENCOUNTER — Ambulatory Visit (AMBULATORY_SURGERY_CENTER): Payer: Self-pay

## 2021-02-08 ENCOUNTER — Other Ambulatory Visit: Payer: Self-pay

## 2021-02-08 VITALS — Ht 67.0 in | Wt 264.0 lb

## 2021-02-08 DIAGNOSIS — Z1211 Encounter for screening for malignant neoplasm of colon: Secondary | ICD-10-CM

## 2021-02-08 MED ORDER — NA SULFATE-K SULFATE-MG SULF 17.5-3.13-1.6 GM/177ML PO SOLN
1.0000 | Freq: Once | ORAL | 0 refills | Status: AC
  Filled 2021-02-08: qty 354, 2d supply, fill #0

## 2021-02-08 NOTE — Progress Notes (Signed)

## 2021-02-14 ENCOUNTER — Other Ambulatory Visit (HOSPITAL_BASED_OUTPATIENT_CLINIC_OR_DEPARTMENT_OTHER): Payer: Self-pay

## 2021-02-22 ENCOUNTER — Ambulatory Visit (AMBULATORY_SURGERY_CENTER): Payer: No Typology Code available for payment source | Admitting: Gastroenterology

## 2021-02-22 ENCOUNTER — Encounter: Payer: Self-pay | Admitting: Gastroenterology

## 2021-02-22 ENCOUNTER — Other Ambulatory Visit: Payer: Self-pay

## 2021-02-22 VITALS — BP 134/81 | HR 84 | Temp 97.6°F | Resp 19 | Ht 67.0 in | Wt 264.0 lb

## 2021-02-22 DIAGNOSIS — D124 Benign neoplasm of descending colon: Secondary | ICD-10-CM

## 2021-02-22 DIAGNOSIS — Z1211 Encounter for screening for malignant neoplasm of colon: Secondary | ICD-10-CM | POA: Diagnosis present

## 2021-02-22 DIAGNOSIS — K635 Polyp of colon: Secondary | ICD-10-CM

## 2021-02-22 MED ORDER — SODIUM CHLORIDE 0.9 % IV SOLN: 500.0000 mL | Freq: Once | INTRAVENOUS | Status: DC

## 2021-02-22 NOTE — Op Note (Signed)
[facility] Patient Name: Kellie [name] Procedure Date: 02/22/2021 9:05 AM MRN: [id number] Endoscopist: Milus Banister , MD Age: 45 Referring MD:  Date of Birth: [birth year] Gender: Female Account #: [id number] Procedure:                Colonoscopy Indications:              Screening for colorectal malignant neoplasm Medicines:                Monitored Anesthesia Care Procedure:                Pre-Anesthesia Assessment:                           - Prior to the procedure, a History and Physical                            was performed, and patient medications and                            allergies were reviewed. The patient's tolerance of                            previous anesthesia was also reviewed. The risks                            and benefits of the procedure and the sedation                            options and risks were discussed with the patient.                            All questions were answered, and informed consent                            was obtained. Prior Anticoagulants: The patient has                            taken no previous anticoagulant or antiplatelet                            agents. ASA Grade Assessment: III - A patient with                            severe systemic disease. After reviewing the risks                            and benefits, the patient was deemed in                            satisfactory condition to undergo the procedure.                           After obtaining informed consent, the colonoscope  was passed under direct vision. Throughout the                            procedure, the patient's blood pressure, pulse, and                            oxygen saturations were monitored continuously. The                            Olympus CF-HQ190 [id number]) Colonoscope was                            introduced through the anus and advanced to the the                            cecum, identified  by appendiceal orifice and                            ileocecal valve. The colonoscopy was performed                            without difficulty. The patient tolerated the                            procedure well. The quality of the bowel                            preparation was good. The ileocecal valve,                            appendiceal orifice, and rectum were photographed. Scope In: 9:13:18 AM Scope Out: 9:26:22 AM Scope Withdrawal Time: 0 hours 10 minutes 5 seconds  Total Procedure Duration: 0 hours 13 minutes 4 seconds  Findings:                 A 4 mm polyp was found in the descending colon. The                            polyp was sessile. The polyp was removed with a                            cold snare. Resection and retrieval were complete.                           Multiple small and large-mouthed diverticula were                            found in the left colon.                           Internal hemorrhoids were found. The hemorrhoids                            were small.  The exam was otherwise without abnormality on                            direct and retroflexion views. Complications:            No immediate complications. Estimated blood loss:                            None. Estimated Blood Loss:     Estimated blood loss: none. Impression:               - One 4 mm polyp in the descending colon, removed                            with a cold snare. Resected and retrieved.                           - Diverticulosis in the left colon.                           - Internal hemorrhoids.                           - The examination was otherwise normal on direct                            and retroflexion views. Recommendation:           - Patient has a contact number available for                            emergencies. The signs and symptoms of potential                            delayed complications were discussed with the                             patient. Return to normal activities tomorrow.                            Written discharge instructions were provided to the                            patient.                           - Resume previous diet.                           - Continue present medications.                           - Await pathology results. Milus Banister, MD 02/22/2021 9:28:33 AM This report has been signed electronically.

## 2021-02-22 NOTE — Progress Notes (Signed)
Called to room to assist during endoscopic procedure.  Patient ID and intended procedure confirmed with present staff. Received instructions for my participation in the procedure from the performing physician.  

## 2021-02-22 NOTE — Progress Notes (Signed)
Medical history reviewed with no changes noted. VS assessed by C.W 

## 2021-02-22 NOTE — Progress Notes (Signed)
To PACU, VSS. Report to [email or web address] 

## 2021-02-22 NOTE — Patient Instructions (Signed)
Impression/REcommendations:  Polyp, diverticulosis, and hemorrhoid handouts given to patient.  Resume previous diet. Continue present medications. Await pathology results.  YOU HAD AN ENDOSCOPIC PROCEDURE TODAY AT [facility]:   Refer to the procedure report that was given to you for any specific questions about what was found during the examination.  If the procedure report does not answer your questions, please call your gastroenterologist to clarify.  If you requested that your care partner not be given the details of your procedure findings, then the procedure report has been included in a sealed envelope for you to review at your convenience later.  YOU SHOULD EXPECT: Some feelings of bloating in the abdomen. Passage of more gas than usual.  Walking can help get rid of the air that was put into your GI tract during the procedure and reduce the bloating. If you had a lower endoscopy (such as a colonoscopy or flexible sigmoidoscopy) you may notice spotting of blood in your stool or on the toilet paper. If you underwent a bowel prep for your procedure, you may not have a normal bowel movement for a few days.  Please Note:  You might notice some irritation and congestion in your nose or some drainage.  This is from the oxygen used during your procedure.  There is no need for concern and it should clear up in a day or so.  SYMPTOMS TO REPORT IMMEDIATELY:   Following lower endoscopy (colonoscopy or flexible sigmoidoscopy):  Excessive amounts of blood in the stool  Significant tenderness or worsening of abdominal pains  Swelling of the abdomen that is new, acute  Fever of 100F or higher For urgent or emergent issues, a gastroenterologist can be reached at any hour by calling [phone]. Do not use MyChart messaging for urgent concerns.    DIET:  We do recommend a small meal at first, but then you may proceed to your regular diet.  Drink plenty of fluids but you should  avoid alcoholic beverages for 24 hours.  ACTIVITY:  You should plan to take it easy for the rest of today and you should NOT DRIVE or use heavy machinery until tomorrow (because of the sedation medicines used during the test).    FOLLOW UP: Our staff will call the number listed on your records 48-72 hours following your procedure to check on you and address any questions or concerns that you may have regarding the information given to you following your procedure. If we do not reach you, we will leave a message.  We will attempt to reach you two times.  During this call, we will ask if you have developed any symptoms of COVID 19. If you develop any symptoms (ie: fever, flu-like symptoms, shortness of breath, cough etc.) before then, please call [phone].  If you test positive for Covid 19 in the 2 weeks post procedure, please call and report this information to [place].    If any biopsies were taken you will be contacted by phone or by letter within the next 1-3 weeks.  Please call us at [phone] if you have not heard about the biopsies in 3 weeks.    SIGNATURES/CONFIDENTIALITY: You and/or your care partner have signed paperwork which will be entered into your electronic medical record.  These signatures attest to the fact that that the information above on your After Visit Summary has been reviewed and is understood.  Full responsibility of the confidentiality of this discharge information lies with you and/or your  care-partner.

## 2021-02-26 ENCOUNTER — Telehealth: Payer: Self-pay | Admitting: *Deleted

## 2021-02-26 NOTE — Telephone Encounter (Signed)
  Follow up Call-  Call back number 02/22/2021  Post procedure Call Back phone  # [phone]  Permission to leave phone message Yes  Some recent data might be hidden     Patient questions:  Do you have a fever, pain , or abdominal swelling? No. Pain Score  0 *  Have you tolerated food without any problems? Yes.    Have you been able to return to your normal activities? Yes.    Do you have any questions about your discharge instructions: Diet   No. Medications  No. Follow up visit  No.  Do you have questions or concerns about your Care? No.  Actions: * If pain score is 4 or above: No action needed, pain <4.  1. Have you developed a fever since your procedure? no  2.   Have you had an respiratory symptoms (SOB or cough) since your procedure? no  3.   Have you tested positive for COVID 19 since your procedure no  4.   Have you had any family members/close contacts diagnosed with the COVID 19 since your procedure?  no   If yes to any of these questions please route to Joylene John, RN and Joella Prince, RN

## 2021-03-07 ENCOUNTER — Encounter: Payer: Self-pay | Admitting: Gastroenterology

## 2021-05-28 ENCOUNTER — Ambulatory Visit: Payer: No Typology Code available for payment source

## 2021-05-28 NOTE — Progress Notes (Deleted)
    SUBJECTIVE:   CHIEF COMPLAINT / HPI: right hip pain   ***  PERTINENT  PMH / PSH: Left atrial enlargement, elevated BMI, family history of CAD, family history of DM  OBJECTIVE:   There were no vitals taken for this visit.  ***  ASSESSMENT/PLAN:   No problem-specific Assessment & Plan notes found for this encounter.     Kellie [name], [place]   {    This will disappear when note is signed, click to select method of visit    :1}

## 2021-05-30 NOTE — Patient Instructions (Addendum)
It was wonderful to see you today.  Please bring ALL of your medications with you to every visit.   Today we talked about:  Right hip pain. You have piriformis syndrome.  Below are home stretches.  Heat before activity and ice afterwards.  You can use naproxen for pain relief and to decrease inflammation.  Follow-up if no improvement in 2 to 3 weeks.  Left knee pain. Your discomfort is likely due to arthritis. You can continue your activities as tolerated and naproxen as above.   Please call the clinic at [phone] if your symptoms worsen or you have any concerns. It was our pleasure to serve you.  Dr. Janus Molder  Piriformis Syndrome Rehab Ask your health care provider which exercises are safe for you. Do exercises exactly as told by your health care provider and adjust them as directed. It is normal to feel mild stretching, pulling, tightness, or discomfort as you do these exercises. Stop right away if you feel sudden pain or your pain gets worse. Do not begin these exercises until told by your health care provider. Stretching and range-of-motion exercises These exercises warm up your muscles and joints and improve the movement and flexibility of your hip and pelvis. The exercises also help to relieve pain,numbness, and tingling. Hip rotation This is an exercise in which you lie on your back and stretch the muscles that rotate your hip (hip rotators) to stretch your buttocks. Lie on your back on a firm surface. Pull your left / right knee toward your same shoulder with your left / right hand until your knee is pointing toward the ceiling. Hold your left / right ankle with your other hand. Keeping your knee steady, gently pull your left / right ankle toward your other shoulder until you feel a stretch in your buttocks. Hold this position for __________ seconds. Repeat __________ times. Complete this exercise __________ times a day. Hip extensor This is an exercise in which you lie on  your back and pull your knee to your chest. Lie on your back on a firm surface. Both of your legs should be straight. Pull your left / right knee to your chest. Hold your leg in this position by holding onto the back of your thigh or the front of your knee. Hold this position for __________ seconds. Slowly return to the starting position. Repeat __________ times. Complete this exercise __________ times a day. Strengthening exercises These exercises build strength and endurance in your hip and thigh muscles. Endurance is the ability to use your muscles for a long time, even after theyget tired. Straight leg raises, side-lying This exercise strengthens the muscles that rotate the leg at the hip and move it away from your body (hip abductors). Lie on your side with your left / right leg in the top position. Lie so your head, shoulder, knee, and hip line up. Bend your bottom knee to help you balance. Lift your top leg 4-6 inches (10-15 cm) while keeping your toes pointed straight ahead. Hold this position for __________ seconds. Slowly lower your leg to the starting position. Let your muscles relax completely after each repetition. Repeat __________ times. Complete this exercise __________ times a day. Hip abduction and rotation This is sometimes called quadruped (on hands and knees) exercises. Get on your hands and knees on a firm, lightly padded surface. Your hands should be directly below your shoulders, and your knees should be directly below your hips. Lift your left / right knee out to the  side. Keep your knee bent. Do not twist your body. Hold this position for __________ seconds. Slowly lower your leg. Repeat __________ times. Complete this exercise __________ times a day. Straight leg raises, face-down This exercise stretches the muscles that move your hips away from the front of the pelvis (hip extensors). Lie on your abdomen on a bed or a firm surface with a pillow under your  hips. Squeeze your buttocks muscles and lift your left / right leg about 4-6 inches (10-15 cm) off the bed. Do not let your back arch. Hold this position for __________ seconds. Slowly lower your leg to the starting position. Let your muscles relax completely after each repetition. Repeat __________ times. Complete this exercise __________ times a day. This information is not intended to replace advice given to you by your health care provider. Make sure you discuss any questions you have with your healthcare provider. Document Revised: 03/10/2019 Document Reviewed: 09/09/2018 Elsevier Patient Education  2022 [employer].

## 2021-05-30 NOTE — Progress Notes (Signed)
    SUBJECTIVE:   CHIEF COMPLAINT / HPI:   [name] is a 45 yo F who presents for the below.   Right Hip Pain Ongoing for 2 weeks.  Over right buttock.  No radiation.  No weakness or reported trauma.  Works out 5 days a week.  Improved with exercise modifiers.  Has since stopped working out in pain has improved.  Also pain improving with switching side of the bed.  Mostly with movement and improves over time with movement.  Denies fever.  Has not taken any medication for it.  Left knee Pain Ongoing for 2 weeks worsening when rising from a seated position such as a toilet.  Believes this is due to her putting more weight on her left knee due to her right hip pain.  Denies fever, trauma, audible popping or grinding.  PERTINENT  PMH / PSH: Obesity  OBJECTIVE:   BP (!) 135/91   Pulse 95   Wt 277 lb 6.4 oz (125.8 kg)   SpO2 100%   BMI 43.45 kg/m    General: Appears well, no acute distress. Age appropriate.  Right hip exam No deformity. FROM with 5/5 strength. Right buttock with point tenderness over piriformis NVI distally. Negative logroll Negative faber, fadir. Positive piriformis stretches.   Left knee exam No gross deformity, ecchymoses, swelling. Non TTP at joint line. FROM with normal strength. Negative ant/post drawers. Negative valgus/varus testing. Negative mcmurrays, apleys. NV intact distally.  ASSESSMENT/PLAN:   1. Piriformis syndrome of right side 2 week right buttock discomfort. Tender to palpation in this region. Exam is suggestive. Will treat conservatively and consider if physical therapy is needed if no improvement in 2-3 weeks. - Rest, heat prior to activity, ice after activity - Piriformis stretches - naproxen (NAPROSYN) 500 MG tablet; Take 1 tablet (500 mg total) by mouth 2 (two) times daily with a meal for 10 days.  - F/u in 2-3 if symptoms fail to improve or worsen  2. Arthritis of left knee Likely compensatory changes in knee with right hip  pain with a component of arthritis. Exam is unremarkable. Will treat conservatively. Activity as tolerated. Nsaid tx as above.    Gerlene Fee, [place]

## 2021-05-31 ENCOUNTER — Other Ambulatory Visit (HOSPITAL_COMMUNITY): Payer: Self-pay

## 2021-05-31 ENCOUNTER — Ambulatory Visit (INDEPENDENT_AMBULATORY_CARE_PROVIDER_SITE_OTHER): Payer: No Typology Code available for payment source | Admitting: Family Medicine

## 2021-05-31 ENCOUNTER — Other Ambulatory Visit: Payer: Self-pay

## 2021-05-31 VITALS — BP 135/91 | HR 95 | Wt 277.4 lb

## 2021-05-31 DIAGNOSIS — M1712 Unilateral primary osteoarthritis, left knee: Secondary | ICD-10-CM

## 2021-05-31 DIAGNOSIS — G5701 Lesion of sciatic nerve, right lower limb: Secondary | ICD-10-CM

## 2021-05-31 MED ORDER — NAPROXEN 500 MG PO TABS
500.0000 mg | ORAL_TABLET | Freq: Two times a day (BID) | ORAL | 0 refills | Status: AC
  Filled 2021-05-31: qty 20, 10d supply, fill #0

## 2021-06-10 ENCOUNTER — Other Ambulatory Visit (HOSPITAL_COMMUNITY): Payer: Self-pay

## 2021-12-03 ENCOUNTER — Other Ambulatory Visit (HOSPITAL_BASED_OUTPATIENT_CLINIC_OR_DEPARTMENT_OTHER): Payer: Self-pay

## 2021-12-03 MED ORDER — CARESTART COVID-19 HOME TEST VI KIT
PACK | 0 refills | Status: DC
  Filled 2021-12-03: qty 4, 4d supply, fill #0

## 2022-01-08 ENCOUNTER — Other Ambulatory Visit (HOSPITAL_BASED_OUTPATIENT_CLINIC_OR_DEPARTMENT_OTHER): Payer: Self-pay

## 2022-02-03 ENCOUNTER — Other Ambulatory Visit (HOSPITAL_BASED_OUTPATIENT_CLINIC_OR_DEPARTMENT_OTHER): Payer: Self-pay

## 2022-02-03 ENCOUNTER — Other Ambulatory Visit (HOSPITAL_COMMUNITY): Payer: Self-pay

## 2022-02-03 MED ORDER — VITAMIN D (ERGOCALCIFEROL) 1.25 MG (50000 UNIT) PO CAPS
50000.0000 [IU] | ORAL_CAPSULE | ORAL | 11 refills | Status: DC
  Filled 2022-02-03 (×2): qty 4, 28d supply, fill #0
  Filled 2022-03-04: qty 12, 84d supply, fill #1
  Filled 2022-05-12: qty 12, 84d supply, fill #2
  Filled 2022-09-01: qty 12, 84d supply, fill #3

## 2022-03-04 ENCOUNTER — Other Ambulatory Visit (HOSPITAL_BASED_OUTPATIENT_CLINIC_OR_DEPARTMENT_OTHER): Payer: Self-pay

## 2022-05-06 ENCOUNTER — Encounter: Payer: Self-pay | Admitting: *Deleted

## 2022-05-09 ENCOUNTER — Other Ambulatory Visit (HOSPITAL_COMMUNITY): Payer: Self-pay

## 2022-05-12 ENCOUNTER — Other Ambulatory Visit (HOSPITAL_BASED_OUTPATIENT_CLINIC_OR_DEPARTMENT_OTHER): Payer: Self-pay

## 2022-08-21 ENCOUNTER — Other Ambulatory Visit (HOSPITAL_BASED_OUTPATIENT_CLINIC_OR_DEPARTMENT_OTHER): Payer: Self-pay

## 2022-08-21 MED ORDER — FLUARIX QUADRIVALENT 0.5 ML IM SUSY
PREFILLED_SYRINGE | INTRAMUSCULAR | 0 refills | Status: DC
  Filled 2022-08-21: qty 0.5, 1d supply, fill #0

## 2022-08-29 ENCOUNTER — Other Ambulatory Visit (HOSPITAL_BASED_OUTPATIENT_CLINIC_OR_DEPARTMENT_OTHER): Payer: Self-pay

## 2022-09-01 ENCOUNTER — Other Ambulatory Visit (HOSPITAL_BASED_OUTPATIENT_CLINIC_OR_DEPARTMENT_OTHER): Payer: Self-pay

## 2022-09-13 ENCOUNTER — Other Ambulatory Visit (HOSPITAL_BASED_OUTPATIENT_CLINIC_OR_DEPARTMENT_OTHER): Payer: Self-pay

## 2022-12-22 ENCOUNTER — Encounter: Payer: Self-pay | Admitting: Family Medicine

## 2022-12-22 ENCOUNTER — Ambulatory Visit: Payer: 59 | Admitting: Family Medicine

## 2022-12-22 VITALS — BP 124/70 | HR 86 | Ht 67.0 in | Wt 253.0 lb

## 2022-12-22 DIAGNOSIS — Z0000 Encounter for general adult medical examination without abnormal findings: Secondary | ICD-10-CM | POA: Diagnosis not present

## 2022-12-22 DIAGNOSIS — Z833 Family history of diabetes mellitus: Secondary | ICD-10-CM

## 2022-12-22 DIAGNOSIS — M75101 Unspecified rotator cuff tear or rupture of right shoulder, not specified as traumatic: Secondary | ICD-10-CM | POA: Diagnosis not present

## 2022-12-22 DIAGNOSIS — Z8249 Family history of ischemic heart disease and other diseases of the circulatory system: Secondary | ICD-10-CM | POA: Diagnosis not present

## 2022-12-22 NOTE — Patient Instructions (Signed)
Great job on the weight loss.  Keep it up. You have rotator cuff syndrome - very common chonic/recurrent problem at your age.  Go to PT and learn exercises that will hopefully keep it in check.  Also work on your posture.   I will call with blood test results.   It has been a pleasure taking care of you.  Dr. Ky Barban is taking over my patients.  Please give her a try.

## 2022-12-23 ENCOUNTER — Encounter: Payer: Self-pay | Admitting: Family Medicine

## 2022-12-23 NOTE — Assessment & Plan Note (Signed)
Refer to PT

## 2022-12-23 NOTE — Assessment & Plan Note (Signed)
Healthy woman with no at risk behaviors.  She is working on her main problem of obesity.

## 2022-12-23 NOTE — Progress Notes (Signed)
    SUBJECTIVE:   CHIEF COMPLAINT / HPI:   Annual physical: Acute problems: Right shoulder pains worsening over past several months.  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  Can't lay on that side at night.  No recent trauma.  Perhaps had some remote trauma as an active teen, but not enough to seek medical attention. Chronic problems: Obesity.  Topped out at 300+lbs.  Has been diligent with diet and exercise.  Nicely down to 253 - an impressive 50lb weight loss.   Family hx of DM and of premature ASCVD. HPDP.   Needs lipid panel.  Had recent colonoscopy, which only showed hyperplastic polyps.  .  Gets Paps through her gynecologist.  Not interested in [facility] booster.  No alcohol, tobacco or at risk behaviors.    PERTINENT  PMH / PSH: Denies CP, DOE, bleeding, worrisome skin lesions, weakness or fatigue. Denies change in bowel, bladder, urine or appetite.  Still with regular periods.  Having minor issue with hot flashes.  OBJECTIVE:   BP 124/70   Pulse 86   Ht '5\' 7"'$  (1.702 m)   Wt 253 lb (114.8 kg)   LMP 12/20/2022 (Exact Date)   SpO2 98%   BMI 39.63 kg/m   HEENT WNL Neck supple Lungs clear Cardiac RRR without m or g Abd benign Ext no edema. Right shoulder + pain on impingement and empty can sign.  ASSESSMENT/PLAN:   Routine general medical examination at a health care facility Healthy woman with no at risk behaviors.  She is working on her main problem of obesity.  Family history of premature CAD Lipid panel.  Continue to work on weight loss.  Rotator cuff syndrome, right Refer to PT   Family history of diabetes mellitus A1C.  Continue to work on weight loss.     Zenia Resides, MD [place]

## 2022-12-23 NOTE — Assessment & Plan Note (Signed)
A1C.  Continue to work on weight loss.

## 2022-12-23 NOTE — Assessment & Plan Note (Signed)
Lipid panel.  Continue to work on weight loss.

## 2023-01-02 DIAGNOSIS — Z1231 Encounter for screening mammogram for malignant neoplasm of breast: Secondary | ICD-10-CM | POA: Diagnosis not present

## 2023-01-02 DIAGNOSIS — H524 Presbyopia: Secondary | ICD-10-CM | POA: Diagnosis not present

## 2023-01-06 ENCOUNTER — Encounter: Payer: Self-pay | Admitting: Family Medicine

## 2023-01-07 DIAGNOSIS — Z01419 Encounter for gynecological examination (general) (routine) without abnormal findings: Secondary | ICD-10-CM | POA: Diagnosis not present

## 2023-01-21 ENCOUNTER — Ambulatory Visit: Payer: 59 | Attending: Family Medicine

## 2023-01-21 ENCOUNTER — Other Ambulatory Visit: Payer: Self-pay

## 2023-01-21 DIAGNOSIS — M75101 Unspecified rotator cuff tear or rupture of right shoulder, not specified as traumatic: Secondary | ICD-10-CM | POA: Diagnosis not present

## 2023-01-21 DIAGNOSIS — M25611 Stiffness of right shoulder, not elsewhere classified: Secondary | ICD-10-CM | POA: Diagnosis not present

## 2023-01-21 DIAGNOSIS — R252 Cramp and spasm: Secondary | ICD-10-CM | POA: Diagnosis not present

## 2023-01-21 DIAGNOSIS — M25511 Pain in right shoulder: Secondary | ICD-10-CM | POA: Diagnosis not present

## 2023-01-21 DIAGNOSIS — M6281 Muscle weakness (generalized): Secondary | ICD-10-CM | POA: Insufficient documentation

## 2023-01-21 NOTE — Therapy (Signed)
OUTPATIENT PHYSICAL THERAPY UPPER EXTREMITY EVALUATION   Patient Name: JONISHA LEPPO MRN: [id number] DOB:Sep 13, 1976, 47 y.o., female Today's Date: 01/22/2023  END OF SESSION:  PT End of Session - 01/21/23 1533     Visit Number 1    Date for PT Re-Evaluation 03/18/23    Authorization Type [place] AETNA FOCUS    PT Start Time 1533    PT Stop Time 1615    PT Time Calculation (min) 42 min    Activity Tolerance Patient tolerated treatment well    Behavior During Therapy [facility] for tasks assessed/performed             Past Medical History:  Diagnosis Date   Allergy    Past Surgical History:  Procedure Laterality Date   ABLATION     CESAREAN SECTION     Patient Active Problem List   Diagnosis Date Noted   Rotator cuff syndrome, right 12/22/2022   Colon cancer screening 12/10/2020   Family history of diabetes mellitus 12/10/2020   Family history of premature CAD 09/15/2019   Obesity (BMI 35.0-39.9 without comorbidity) 09/09/2017   Routine general medical examination at a health care facility 10/07/2012    PCP: Kinnie Feil, MD   REFERRING PROVIDER: Zenia Resides, MD  REFERRING DIAG: M75.101 (ICD-10-CM) - Rotator cuff syndrome, right  THERAPY DIAG:  Acute pain of right shoulder  Stiffness of right shoulder, not elsewhere classified  Cramp and spasm  Muscle weakness (generalized)  Rationale for Evaluation and Treatment: Rehabilitation  ONSET DATE: 10/01/22  SUBJECTIVE:                                                                                                                                                                                      SUBJECTIVE STATEMENT: 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  She mainly has symptoms with shoulder in abducted position when she internally rotates while she is performing an ultrasound.  She is continuing to work but this is slowing her  down and she is aching for most of the day and into the evening when she gets home.    PERTINENT HISTORY: na  PAIN:  Are you having pain?  Yes 3/10 at rest but increases with activity  PRECAUTIONS: None  WEIGHT BEARING RESTRICTIONS: No  FALLS:  Has patient fallen in last 6 months? No  LIVING ENVIRONMENT: Lives with: lives with their family and lives with their spouse Lives in: House/apartment   OCCUPATION: Ultrasound tech  PLOF: Independent, Independent with basic ADLs, Independent with household mobility without device, Independent with community mobility without device,  Independent with homemaking with ambulation, Independent with gait, and Independent with transfers  PATIENT GOALS: to eliminate pain so that I can do my work   NEXT MD VISIT: prn  OBJECTIVE:   DIAGNOSTIC FINDINGS:  na  PATIENT SURVEYS :  FOTO 55, predicted 46  COGNITION: Overall cognitive status: Within functional limits for tasks assessed     SENSATION: WFL  POSTURE: Rounded shoulders  UPPER EXTREMITY ROM:   Right shoulder: All WNL, pain only with IT but more so with abduction and IR combined  UPPER EXTREMITY MMT:  Right shoulder: Generally 5/5 with exception scaption with IR and with ER  SHOULDER SPECIAL TESTS: Impingement tests: Neer impingement test: positive  SLAP lesions: Clunk test: negative Instability tests: Apprehension test: negative Rotator cuff assessment: Empty can test: negative Biceps assessment: Speed's test: negative  JOINT MOBILITY TESTING:  Slightly hypermobile anteriorly and inferiorly  PALPATION:  Tender anterior shoulder in the area of the long head of the biceps/ bicipital groove   TODAY'S TREATMENT:                                                                                                                                         DATE: 01/21/23 Initiated HEP and initial eval completed  PATIENT EDUCATION: Education details: Initiated HEP Person  educated: Patient Education method: Consulting civil engineer, Media planner, Verbal cues, and Handouts Education comprehension: verbalized understanding, returned demonstration, and verbal cues required  HOME EXERCISE PROGRAM: Access Code: [email or web address] URL: [email or web address] Date: 01/21/2023 Prepared by: Candyce Churn  Exercises - Prone Shoulder Extension - Single Arm  - 2 x daily - 7 x weekly - 2 sets - 10 reps - Prone Shoulder Row  - 2 x daily - 7 x weekly - 2 sets - 10 reps - Prone Single Arm Shoulder Horizontal Abduction with Scapular Retraction and Palm Down  - 2 x daily - 7 x weekly - 2 sets - 10 reps - Sidelying Shoulder External Rotation  - 2 x daily - 7 x weekly - 2 sets - 10 reps - Single Arm Serratus Punches in Supine with Dumbbell  - 2 x daily - 7 x weekly - 2 sets - 10 reps - Standing Shoulder Flexion to 90 Degrees with Dumbbells  - 2 x daily - 7 x weekly - 2 sets - 10 reps - Standing Shoulder Scaption  - 2 x daily - 7 x weekly - 2 sets - 10 reps  ASSESSMENT:  CLINICAL IMPRESSION: Patient is a 47 y.o. female who was seen today for physical therapy evaluation and treatment for right shoulder pain.  She presents with mild ROM limitation with IR combined with abduction.  Impingement tests are positive.  No s/s of rotator cuff tear but joint is slightly hypermobile.  She would benefit from right shoulder strengthening and scapular stabilization along with modalities to control inflammation.     OBJECTIVE IMPAIRMENTS: decreased ROM, decreased strength,  increased fascial restrictions, increased muscle spasms, impaired UE functional use, and pain.   ACTIVITY LIMITATIONS: carrying, lifting, dressing, reach over head, and hygiene/grooming  PARTICIPATION LIMITATIONS: meal prep, cleaning, laundry, community activity, and occupation  PERSONAL FACTORS: Profession are also affecting patient's functional outcome.   REHAB POTENTIAL: Good  CLINICAL DECISION MAKING:  Stable/uncomplicated  EVALUATION COMPLEXITY: Low  GOALS: Goals reviewed with patient? Yes  SHORT TERM GOALS: Target date: 02/18/2023    Pain report to be no greater than 4/10  Baseline: Goal status: INITIAL  2.  Patient will be independent with initial HEP  Baseline:  Goal status: INITIAL  3.  Patient to report 50% improvement in ability to do her job Baseline:  Goal status: INITIAL   LONG TERM GOALS: Target date: 03/18/2023    Patient to report pain no greater than 2/10  Baseline:  Goal status: INITIAL  2.  Patient to be independent with advanced HEP  Baseline:  Goal status: INITIAL  3.  Patient to report 85% improvement in overall symptoms and ability to do her usual work Baseline:  Goal status: INITIAL  4.  Patient to be able to reach overhead into cabinets and on top of shelves without pain Baseline:  Goal status: INITIAL  5.  Patient to have full painfree ROM Baseline:  Goal status: INITIAL  6.  FOTO to improve to 71 or better Baseline:  Goal status: INITIAL  PLAN: PT FREQUENCY: 1-2x/week  PT DURATION: 8 weeks  PLANNED INTERVENTIONS: Therapeutic exercises, Therapeutic activity, Neuromuscular re-education, Patient/Family education, Self Care, Joint mobilization, Dry Needling, Electrical stimulation, Cryotherapy, Moist heat, Taping, Vasopneumatic device, Traction, Ultrasound, Ionotophoresis 6m/ml Dexamethasone, Manual therapy, and Re-evaluation  PLAN FOR NEXT SESSION: Review HEP, Progress shoulder and scapular stability strength, ice to shoulder following treatment if patient having pain or experiences soreness during session   Mackenzy Eisenberg B. Zaylah Blecha, PT 01/22/23 9:52 AM  [id number] SB[place] Run[place]elle[place] [id number] # [phone] [phone]

## 2023-02-17 ENCOUNTER — Ambulatory Visit: Payer: 59 | Attending: Family Medicine | Admitting: Physical Therapy

## 2023-02-17 ENCOUNTER — Encounter: Payer: Self-pay | Admitting: Physical Therapy

## 2023-02-17 DIAGNOSIS — R252 Cramp and spasm: Secondary | ICD-10-CM | POA: Insufficient documentation

## 2023-02-17 DIAGNOSIS — M25511 Pain in right shoulder: Secondary | ICD-10-CM | POA: Diagnosis not present

## 2023-02-17 DIAGNOSIS — M6281 Muscle weakness (generalized): Secondary | ICD-10-CM | POA: Diagnosis not present

## 2023-02-17 DIAGNOSIS — M25611 Stiffness of right shoulder, not elsewhere classified: Secondary | ICD-10-CM | POA: Insufficient documentation

## 2023-02-17 NOTE — Therapy (Signed)
OUTPATIENT PHYSICAL THERAPY TREATMENT NOTE    Patient Name: Kellie [name] MRN: [id number] DOB:[birth year], 47 y.o., female Today's Date: 02/17/2023  END OF SESSION:  PT End of Session - 02/17/23 1534     Visit Number 2    Date for PT Re-Evaluation 03/18/23    Authorization Type [place] AETNA FOCUS    PT Start Time 1532    PT Stop Time 1603    PT Time Calculation (min) 31 min    Activity Tolerance Patient tolerated treatment well    Behavior During Therapy [facility] for tasks assessed/performed              Past Medical History:  Diagnosis Date   Allergy    Past Surgical History:  Procedure Laterality Date   ABLATION     CESAREAN SECTION     Patient Active Problem List   Diagnosis Date Noted   Rotator cuff syndrome, right 12/22/2022   Colon cancer screening 12/10/2020   Family history of diabetes mellitus 12/10/2020   Family history of premature CAD 09/15/2019   Obesity (BMI 35.0-39.9 without comorbidity) 09/09/2017   Routine general medical examination at a health care facility 10/07/2012    PCP: Kinnie Feil, MD   REFERRING PROVIDER: Zenia Resides, MD  REFERRING DIAG: M75.101 (ICD-10-CM) - Rotator cuff syndrome, right  THERAPY DIAG:  Acute pain of right shoulder  Stiffness of right shoulder, not elsewhere classified  Cramp and spasm  Muscle weakness (generalized)  Rationale for Evaluation and Treatment: Rehabilitation  ONSET DATE: 10/01/22  SUBJECTIVE:                                                                                                                                                                                      SUBJECTIVE STATEMENT: Patient states that things are going ok. She did not do her HEP since the eval. She just got off work and has a meeting after this.  PERTINENT HISTORY: na  PAIN:  Are you having pain?  no  PRECAUTIONS: None  WEIGHT BEARING RESTRICTIONS: No  FALLS:  Has patient fallen in last 6 months?  No  LIVING ENVIRONMENT: Lives with: lives with their family and lives with their spouse Lives in: House/apartment   OCCUPATION: Ultrasound tech  PLOF: Independent, Independent with basic ADLs, Independent with household mobility without device, Independent with community mobility without device, Independent with homemaking with ambulation, Independent with gait, and Independent with transfers  PATIENT GOALS: to eliminate pain so that I can do my work   NEXT MD VISIT: prn  OBJECTIVE:   DIAGNOSTIC FINDINGS:  na  PATIENT SURVEYS :  FOTO 55, predicted 41  COGNITION: Overall cognitive status: Within functional limits for tasks assessed     SENSATION: WFL  POSTURE: Rounded shoulders  UPPER EXTREMITY ROM:   Right shoulder: All WNL, pain only with IT but more so with abduction and IR combined  UPPER EXTREMITY MMT:  Right shoulder: Generally 5/5 with exception scaption with IR and with ER  SHOULDER SPECIAL TESTS: Impingement tests: Neer impingement test: positive  SLAP lesions: Clunk test: negative Instability tests: Apprehension test: negative Rotator cuff assessment: Empty can test: negative Biceps assessment: Speed's test: negative  JOINT MOBILITY TESTING:  Slightly hypermobile anteriorly and inferiorly  PALPATION:  Tender anterior shoulder in the area of the long head of the biceps/ bicipital groove   TODAY'S TREATMENT:                                                                                                                                         DATE:  02/14/23 UBE x1 min forward/backward Supine serratus punch #5 x10 reps Sidelying Rt horizontal abd #2 x10 Seated serratus press red TB x10 reps Seated bear hugs red TB x10 reps HEP demo seated flexion and scaption to 90 deg Sideyling thoracic rotation x15 reps  Standing rows x10 reps green TB  01/21/23 Initiated HEP and initial eval completed  PATIENT EDUCATION: Education details: Initiated  HEP Person educated: Patient Education method: Consulting civil engineer, Media planner, Verbal cues, and Handouts Education comprehension: verbalized understanding, returned demonstration, and verbal cues required  HOME EXERCISE PROGRAM: Access Code: [email or web address] URL: [email or web address] Date: 02/17/2023 Prepared by: [facility]  Exercises - Standing Shoulder Flexion to 90 Degrees with Dumbbells  - 2 x daily - 7 x weekly - 10 reps - Standing Shoulder Scaption  - 2 x daily - 7 x weekly - 1 sets - 10 reps - Seated Shoulder W External Rotation on Swiss Ball  - 2 x daily - 7 x weekly - 1 sets - 10 reps - Seated Scapular Protraction with Resistance  - 2 x daily - 7 x weekly - 1 sets - 10 reps - Standing Bilateral Low Shoulder Row with Anchored Resistance  - 2 x daily - 7 x weekly - 1 sets - 10 reps  ASSESSMENT:  CLINICAL IMPRESSION: Patient has had difficulty with HEP adherence since the evaluation. Session focused on modifying her program to an upright position in order to increase compliance. Pt demonstrated good understanding of these updates and denied pain during or following the session. PT also discussed ways to decrease shoulder internal rotation and abduction throughout her work shift. Pt had good understanding of this.    OBJECTIVE IMPAIRMENTS: decreased ROM, decreased strength, increased fascial restrictions, increased muscle spasms, impaired UE functional use, and pain.   ACTIVITY LIMITATIONS: carrying, lifting, dressing, reach over head, and hygiene/grooming  PARTICIPATION LIMITATIONS: meal prep, cleaning, laundry, community activity, and occupation  PERSONAL FACTORS: Profession are also  affecting patient's functional outcome.   REHAB POTENTIAL: Good  CLINICAL DECISION MAKING: Stable/uncomplicated  EVALUATION COMPLEXITY: Low  GOALS: Goals reviewed with patient? Yes  SHORT TERM GOALS: Target date: 02/18/2023    Pain  report to be no greater than 4/10  Baseline: Goal status: INITIAL  2.  Patient will be independent with initial HEP  Baseline:  Goal status: INITIAL  3.  Patient to report 50% improvement in ability to do her job Baseline:  Goal status: INITIAL   LONG TERM GOALS: Target date: 03/18/2023    Patient to report pain no greater than 2/10  Baseline:  Goal status: INITIAL  2.  Patient to be independent with advanced HEP  Baseline:  Goal status: INITIAL  3.  Patient to report 85% improvement in overall symptoms and ability to do her usual work Baseline:  Goal status: INITIAL  4.  Patient to be able to reach overhead into cabinets and on top of shelves without pain Baseline:  Goal status: INITIAL  5.  Patient to have full painfree ROM Baseline:  Goal status: INITIAL  6.  FOTO to improve to 71 or better Baseline:  Goal status: INITIAL  PLAN: PT FREQUENCY: 1-2x/week  PT DURATION: 8 weeks  PLANNED INTERVENTIONS: Therapeutic exercises, Therapeutic activity, Neuromuscular re-education, Patient/Family education, Self Care, Joint mobilization, Dry Needling, Electrical stimulation, Cryotherapy, Moist heat, Taping, Vasopneumatic device, Traction, Ultrasound, Ionotophoresis 4mg /ml Dexamethasone, Manual therapy, and Re-evaluation  PLAN FOR NEXT SESSION: f/u on HEP, progress scap strength and posterior shoulder strength.    4:07 PM,02/17/23 Sherol Dade PT, DPT [place] [facility] [place]

## 2023-02-19 ENCOUNTER — Encounter: Payer: 59 | Admitting: Physical Therapy

## 2023-02-19 NOTE — Therapy (Deleted)
OUTPATIENT PHYSICAL THERAPY TREATMENT NOTE    Patient Name: Kellie [name] MRN: [id number] DOB:1976-09-12, 47 y.o., female Today's Date: 02/19/2023  END OF SESSION:     Past Medical History:  Diagnosis Date   Allergy    Past Surgical History:  Procedure Laterality Date   ABLATION     CESAREAN SECTION     Patient Active Problem List   Diagnosis Date Noted   Rotator cuff syndrome, right 12/22/2022   Colon cancer screening 12/10/2020   Family history of diabetes mellitus 12/10/2020   Family history of premature CAD 09/15/2019   Obesity (BMI 35.0-39.9 without comorbidity) 09/09/2017   Routine general medical examination at a health care facility 10/07/2012    PCP: Kinnie Feil, MD   REFERRING PROVIDER: Zenia Resides, MD  REFERRING DIAG: M75.101 (ICD-10-CM) - Rotator cuff syndrome, right  THERAPY DIAG:  No diagnosis found.  Rationale for Evaluation and Treatment: Rehabilitation  ONSET DATE: 10/01/22  SUBJECTIVE:                                                                                                                                                                                      SUBJECTIVE STATEMENT: Patient states that things are going ok. She did not do her HEP since the eval. She just got off work and has a meeting after this.  PERTINENT HISTORY: na  PAIN:  Are you having pain?  no  PRECAUTIONS: None  WEIGHT BEARING RESTRICTIONS: No  FALLS:  Has patient fallen in last 6 months? No  LIVING ENVIRONMENT: Lives with: lives with their family and lives with their spouse Lives in: House/apartment   OCCUPATION: Ultrasound tech  PLOF: Independent, Independent with basic ADLs, Independent with household mobility without device, Independent with community mobility without device, Independent with homemaking with ambulation, Independent with gait, and Independent with transfers  PATIENT GOALS: to eliminate pain so that I can do my work    NEXT MD VISIT: prn  OBJECTIVE:   DIAGNOSTIC FINDINGS:  na  PATIENT SURVEYS :  FOTO 55, predicted 87  COGNITION: Overall cognitive status: Within functional limits for tasks assessed     SENSATION: WFL  POSTURE: Rounded shoulders  UPPER EXTREMITY ROM:   Right shoulder: All WNL, pain only with IT but more so with abduction and IR combined  UPPER EXTREMITY MMT:  Right shoulder: Generally 5/5 with exception scaption with IR and with ER  SHOULDER SPECIAL TESTS: Impingement tests: Neer impingement test: positive  SLAP lesions: Clunk test: negative Instability tests: Apprehension test: negative Rotator cuff assessment: Empty can test: negative Biceps assessment: Speed's test: negative  JOINT MOBILITY TESTING:  Slightly hypermobile anteriorly and  inferiorly  PALPATION:  Tender anterior shoulder in the area of the long head of the biceps/ bicipital groove   TODAY'S TREATMENT:                                                                                                                                         DATE:  02/14/23 UBE x1 min forward/backward Supine serratus punch #5 x10 reps Sidelying Rt horizontal abd #2 x10 Seated serratus press red TB x10 reps Seated bear hugs red TB x10 reps HEP demo seated flexion and scaption to 90 deg Sideyling thoracic rotation x15 reps  Standing rows x10 reps green TB  01/21/23 Initiated HEP and initial eval completed  PATIENT EDUCATION: Education details: Initiated HEP Person educated: Patient Education method: Consulting civil engineer, Media planner, Verbal cues, and Handouts Education comprehension: verbalized understanding, returned demonstration, and verbal cues required  HOME EXERCISE PROGRAM: Access Code: [email or web address] URL: [email or web address] Date: 02/17/2023 Prepared by: [facility]  Exercises - Standing Shoulder Flexion to 90 Degrees with Dumbbells  - 2 x daily - 7 x  weekly - 10 reps - Standing Shoulder Scaption  - 2 x daily - 7 x weekly - 1 sets - 10 reps - Seated Shoulder W External Rotation on Swiss Ball  - 2 x daily - 7 x weekly - 1 sets - 10 reps - Seated Scapular Protraction with Resistance  - 2 x daily - 7 x weekly - 1 sets - 10 reps - Standing Bilateral Low Shoulder Row with Anchored Resistance  - 2 x daily - 7 x weekly - 1 sets - 10 reps  ASSESSMENT:  CLINICAL IMPRESSION: Patient has had difficulty with HEP adherence since the evaluation. Session focused on modifying her program to an upright position in order to increase compliance. Pt demonstrated good understanding of these updates and denied pain during or following the session. PT also discussed ways to decrease shoulder internal rotation and abduction throughout her work shift. Pt had good understanding of this.    OBJECTIVE IMPAIRMENTS: decreased ROM, decreased strength, increased fascial restrictions, increased muscle spasms, impaired UE functional use, and pain.   ACTIVITY LIMITATIONS: carrying, lifting, dressing, reach over head, and hygiene/grooming  PARTICIPATION LIMITATIONS: meal prep, cleaning, laundry, community activity, and occupation  PERSONAL FACTORS: Profession are also affecting patient's functional outcome.   REHAB POTENTIAL: Good  CLINICAL DECISION MAKING: Stable/uncomplicated  EVALUATION COMPLEXITY: Low  GOALS: Goals reviewed with patient? Yes  SHORT TERM GOALS: Target date: 02/18/2023    Pain report to be no greater than 4/10  Baseline: Goal status: INITIAL  2.  Patient will be independent with initial HEP  Baseline:  Goal status: INITIAL  3.  Patient to report 50% improvement in ability to do her job Baseline:  Goal status: INITIAL   LONG TERM GOALS: Target date: 03/18/2023    Patient to report pain no greater than  2/10  Baseline:  Goal status: INITIAL  2.  Patient to be independent with advanced HEP  Baseline:  Goal status: INITIAL  3.   Patient to report 85% improvement in overall symptoms and ability to do her usual work Baseline:  Goal status: INITIAL  4.  Patient to be able to reach overhead into cabinets and on top of shelves without pain Baseline:  Goal status: INITIAL  5.  Patient to have full painfree ROM Baseline:  Goal status: INITIAL  6.  FOTO to improve to 71 or better Baseline:  Goal status: INITIAL  PLAN: PT FREQUENCY: 1-2x/week  PT DURATION: 8 weeks  PLANNED INTERVENTIONS: Therapeutic exercises, Therapeutic activity, Neuromuscular re-education, Patient/Family education, Self Care, Joint mobilization, Dry Needling, Electrical stimulation, Cryotherapy, Moist heat, Taping, Vasopneumatic device, Traction, Ultrasound, Ionotophoresis 4mg /ml Dexamethasone, Manual therapy, and Re-evaluation  PLAN FOR NEXT SESSION: f/u on HEP, progress scap strength and posterior shoulder strength.    Rudi Heap PT, DPT 02/19/23  11:44 AM

## 2023-02-23 ENCOUNTER — Ambulatory Visit: Payer: 59 | Admitting: Physical Therapy

## 2023-02-25 ENCOUNTER — Ambulatory Visit: Payer: 59

## 2023-02-25 DIAGNOSIS — M6281 Muscle weakness (generalized): Secondary | ICD-10-CM | POA: Diagnosis not present

## 2023-02-25 DIAGNOSIS — M25611 Stiffness of right shoulder, not elsewhere classified: Secondary | ICD-10-CM | POA: Diagnosis not present

## 2023-02-25 DIAGNOSIS — R252 Cramp and spasm: Secondary | ICD-10-CM

## 2023-02-25 DIAGNOSIS — M25511 Pain in right shoulder: Secondary | ICD-10-CM | POA: Diagnosis not present

## 2023-02-25 NOTE — Therapy (Signed)
OUTPATIENT PHYSICAL THERAPY TREATMENT NOTE PHYSICAL THERAPY DISCHARGE SUMMARY  Visits from Start of Care: 3  Current functional level related to goals / functional outcomes: See below   Remaining deficits: See below   Education / Equipment: See below   Patient agrees to discharge. Patient goals were met. Patient is being discharged due to meeting the stated rehab goals.     Patient Name: Kellie [name] MRN: [id number] DOB:1976-06-28, 47 y.o., female Today's Date: 02/25/2023  END OF SESSION:  PT End of Session - 02/25/23 1532     Visit Number 3    Date for PT Re-Evaluation 03/18/23    Authorization Type [place] [employer] FOCUS    PT Start Time 1532    PT Stop Time 1558    PT Time Calculation (min) 26 min    Activity Tolerance Patient tolerated treatment well    Behavior During Therapy [facility] for tasks assessed/performed              Past Medical History:  Diagnosis Date   Allergy    Past Surgical History:  Procedure Laterality Date   ABLATION     CESAREAN SECTION     Patient Active Problem List   Diagnosis Date Noted   Rotator cuff syndrome, right 12/22/2022   Colon cancer screening 12/10/2020   Family history of diabetes mellitus 12/10/2020   Family history of premature CAD 09/15/2019   Obesity (BMI 35.0-39.9 without comorbidity) 09/09/2017   Routine general medical examination at a health care facility 10/07/2012    PCP: Kellie Feil, MD   REFERRING PROVIDER: Zenia Resides, MD  REFERRING DIAG: M75.101 (ICD-10-CM) - Rotator cuff syndrome, right  THERAPY DIAG:  Acute pain of right shoulder  Stiffness of right shoulder, not elsewhere classified  Cramp and spasm  Muscle weakness (generalized)  Rationale for Evaluation and Treatment: Rehabilitation  ONSET DATE: 10/01/22  SUBJECTIVE:                                                                                                                                                                                       SUBJECTIVE STATEMENT: Patient states that she has figured out how to position herself at work and her shoulder pain is significantly improved. She would like to DC at this time.    PERTINENT HISTORY: na  PAIN:  Are you having pain?  no  PRECAUTIONS: None  WEIGHT BEARING RESTRICTIONS: No  FALLS:  Has patient fallen in last 6 months? No  LIVING ENVIRONMENT: Lives with: lives with their family and lives with their spouse Lives in: House/apartment   OCCUPATION: Ultrasound tech  PLOF: Independent, Independent with basic ADLs,  Independent with household mobility without device, Independent with community mobility without device, Independent with homemaking with ambulation, Independent with gait, and Independent with transfers  PATIENT GOALS: to eliminate pain so that I can do my work   NEXT MD VISIT: prn  OBJECTIVE:   DIAGNOSTIC FINDINGS:  na  PATIENT SURVEYS :  FOTO 55, predicted 71 02/25/23: 83  COGNITION: Overall cognitive status: Within functional limits for tasks assessed     SENSATION: WFL  POSTURE: Rounded shoulders  UPPER EXTREMITY ROM:   Right shoulder: All WNL, pain only with IT but more so with abduction and IR combined 02/25/23: Pain minimal with IR and abduction with IR  UPPER EXTREMITY MMT:  Right shoulder: Generally 5/5 with exception scaption with IR and with ER  SHOULDER SPECIAL TESTS: Impingement tests: Neer impingement test: positive  SLAP lesions: Clunk test: negative Instability tests: Apprehension test: negative Rotator cuff assessment: Empty can test: negative Biceps assessment: Speed's test: negative  JOINT MOBILITY TESTING:  Slightly hypermobile anteriorly and inferiorly  PALPATION:  Tender anterior shoulder in the area of the long head of the biceps/ bicipital groove   TODAY'S TREATMENT:                                                                                                                                          DATE:  02/25/23 Reviewed current HEP Added prone shoulder ext, row and horizontal abduction along with side lying ER and supine serratus punch x 20 each, instructed to begin with 0# and work up gradually to 5# Went over DC plan and motions and positions to avoid Completed DC assessment  02/14/23 UBE x1 min forward/backward Supine serratus punch #5 x10 reps Sidelying Rt horizontal abd #2 x10 Seated serratus press red TB x10 reps Seated bear hugs red TB x10 reps HEP demo seated flexion and scaption to 90 deg Sideyling thoracic rotation x15 reps  Standing rows x10 reps green TB  01/21/23 Initiated HEP and initial eval completed  PATIENT EDUCATION: Education details: Initiated HEP Person educated: Patient Education method: Consulting civil engineer, Media planner, Verbal cues, and Handouts Education comprehension: verbalized understanding, returned demonstration, and verbal cues required  HOME EXERCISE PROGRAM: Access Code: [email or web address] URL: [email or web address] Date: 02/25/2023 Prepared by: Kellie [name]  Exercises - Standing Shoulder Flexion to 90 Degrees with Dumbbells  - 2 x daily - 7 x weekly - 10 reps - Standing Shoulder Scaption  - 2 x daily - 7 x weekly - 1 sets - 10 reps - Seated Shoulder W External Rotation on Swiss Ball  - 2 x daily - 7 x weekly - 1 sets - 10 reps - Seated Scapular Protraction with Resistance  - 2 x daily - 7 x weekly - 1 sets - 10 reps - Standing Bilateral Low Shoulder Row with Anchored Resistance  - 2 x daily - 7 x weekly - 1 sets - 10 reps - Prone  Shoulder Extension - Single Arm  - 2 x daily - 7 x weekly - 2 sets - 10 reps - Prone Shoulder Row  - 2 x daily - 7 x weekly - 2 sets - 10 reps - Prone Single Arm Shoulder Horizontal Abduction with Scapular Retraction and Palm Down  - 2 x daily - 7 x weekly - 2 sets - 10 reps - Sidelying Shoulder External Rotation  - 2 x daily - 7 x weekly - 2 sets - 10 reps - Single Arm Serratus Punches in  Supine with Dumbbell  - 2 x daily - 7 x weekly - 2 sets - 10 reps  ASSESSMENT:  CLINICAL IMPRESSION: [name] reported significant improvement in shoulder pain.  She has changed how she positions herself during an ultrasound and feels this has really cut down on her pain.  She understands need to continue her HEP to avoid exacerbation of symptoms.  She met all goals and will be discharged today.      OBJECTIVE IMPAIRMENTS: decreased ROM, decreased strength, increased fascial restrictions, increased muscle spasms, impaired UE functional use, and pain.   ACTIVITY LIMITATIONS: carrying, lifting, dressing, reach over head, and hygiene/grooming  PARTICIPATION LIMITATIONS: meal prep, cleaning, laundry, community activity, and occupation  PERSONAL FACTORS: Profession are also affecting patient's functional outcome.   REHAB POTENTIAL: Good  CLINICAL DECISION MAKING: Stable/uncomplicated  EVALUATION COMPLEXITY: Low  GOALS: Goals reviewed with patient? Yes  SHORT TERM GOALS: Target date: 02/18/2023    Pain report to be no greater than 4/10  Baseline: Goal status: MET  2.  Patient will be independent with initial HEP  Baseline:  Goal status: MET  3.  Patient to report 50% improvement in ability to do her job Baseline:  Goal status: MET   LONG TERM GOALS: Target date: 03/18/2023    Patient to report pain no greater than 2/10  Baseline:  Goal status: MET  2.  Patient to be independent with advanced HEP  Baseline:  Goal status: MET  3.  Patient to report 85% improvement in overall symptoms and ability to do her usual work Baseline:  Goal status: MET  4.  Patient to be able to reach overhead into cabinets and on top of shelves without pain Baseline:  Goal status: MET  5.  Patient to have full painfree ROM Baseline:  Goal status: IN PROGRESS  6.  FOTO to improve to 71 or better Baseline:  Goal status: MET  PLAN: PT FREQUENCY: 1-2x/week  PT DURATION: 8  weeks  PLANNED INTERVENTIONS: Therapeutic exercises, Therapeutic activity, Neuromuscular re-education, Patient/Family education, Self Care, Joint mobilization, Dry Needling, Electrical stimulation, Cryotherapy, Moist heat, Taping, Vasopneumatic device, Traction, Ultrasound, Ionotophoresis 4mg /ml Dexamethasone, Manual therapy, and Re-evaluation  PLAN FOR NEXT SESSION: DCAnderson Malta B. Filmore Molyneux, PT 02/25/23 4:02 PM [facility] [address], [address] [place], [place] [address] Phone # [phone] Fax [phone]

## 2023-09-08 ENCOUNTER — Other Ambulatory Visit (HOSPITAL_BASED_OUTPATIENT_CLINIC_OR_DEPARTMENT_OTHER): Payer: Self-pay

## 2023-09-08 MED ORDER — FLULAVAL 0.5 ML IM SUSY
0.5000 mL | PREFILLED_SYRINGE | Freq: Once | INTRAMUSCULAR | 0 refills | Status: AC
  Filled 2023-09-08: qty 0.5, 1d supply, fill #0

## 2023-10-20 ENCOUNTER — Other Ambulatory Visit (HOSPITAL_BASED_OUTPATIENT_CLINIC_OR_DEPARTMENT_OTHER): Payer: Self-pay

## 2023-10-20 DIAGNOSIS — L659 Nonscarring hair loss, unspecified: Secondary | ICD-10-CM | POA: Diagnosis not present

## 2023-10-20 MED ORDER — CLOBETASOL PROPIONATE 0.05 % EX SOLN
1.0000 | Freq: Every day | CUTANEOUS | 5 refills | Status: DC
  Filled 2023-10-20: qty 50, 30d supply, fill #0
  Filled 2023-11-23: qty 50, 50d supply, fill #1

## 2023-10-21 ENCOUNTER — Other Ambulatory Visit (HOSPITAL_BASED_OUTPATIENT_CLINIC_OR_DEPARTMENT_OTHER): Payer: Self-pay

## 2023-10-26 DIAGNOSIS — L659 Nonscarring hair loss, unspecified: Secondary | ICD-10-CM | POA: Diagnosis not present

## 2023-11-04 ENCOUNTER — Other Ambulatory Visit: Payer: Self-pay

## 2023-11-04 ENCOUNTER — Other Ambulatory Visit (HOSPITAL_BASED_OUTPATIENT_CLINIC_OR_DEPARTMENT_OTHER): Payer: Self-pay

## 2023-11-04 MED ORDER — VITAMIN D (ERGOCALCIFEROL) 1.25 MG (50000 UNIT) PO CAPS
50000.0000 [IU] | ORAL_CAPSULE | ORAL | 11 refills | Status: AC
  Filled 2023-11-04 (×2): qty 4, 28d supply, fill #0
  Filled 2023-11-23 – 2023-12-05 (×2): qty 4, 28d supply, fill #1
  Filled 2023-12-28: qty 4, 28d supply, fill #2
  Filled 2024-01-19: qty 4, 28d supply, fill #3
  Filled 2024-02-22: qty 4, 28d supply, fill #4
  Filled 2024-04-06: qty 4, 28d supply, fill #5
  Filled 2024-05-17: qty 4, 28d supply, fill #6
  Filled 2024-08-15: qty 4, 28d supply, fill #7
  Filled 2024-09-15: qty 4, 28d supply, fill #8
  Filled 2024-10-19: qty 4, 28d supply, fill #9

## 2023-11-05 ENCOUNTER — Other Ambulatory Visit: Payer: Self-pay

## 2023-11-05 ENCOUNTER — Other Ambulatory Visit (HOSPITAL_BASED_OUTPATIENT_CLINIC_OR_DEPARTMENT_OTHER): Payer: Self-pay

## 2023-11-23 ENCOUNTER — Other Ambulatory Visit (HOSPITAL_BASED_OUTPATIENT_CLINIC_OR_DEPARTMENT_OTHER): Payer: Self-pay

## 2023-12-05 ENCOUNTER — Other Ambulatory Visit (HOSPITAL_BASED_OUTPATIENT_CLINIC_OR_DEPARTMENT_OTHER): Payer: Self-pay

## 2023-12-28 ENCOUNTER — Other Ambulatory Visit (HOSPITAL_BASED_OUTPATIENT_CLINIC_OR_DEPARTMENT_OTHER): Payer: Self-pay

## 2023-12-29 ENCOUNTER — Other Ambulatory Visit (HOSPITAL_BASED_OUTPATIENT_CLINIC_OR_DEPARTMENT_OTHER): Payer: Self-pay

## 2023-12-29 DIAGNOSIS — L659 Nonscarring hair loss, unspecified: Secondary | ICD-10-CM | POA: Diagnosis not present

## 2023-12-29 DIAGNOSIS — L853 Xerosis cutis: Secondary | ICD-10-CM | POA: Diagnosis not present

## 2024-01-04 ENCOUNTER — Encounter: Payer: Self-pay | Admitting: Family Medicine

## 2024-01-04 ENCOUNTER — Ambulatory Visit (INDEPENDENT_AMBULATORY_CARE_PROVIDER_SITE_OTHER): Payer: Commercial Managed Care - PPO | Admitting: Family Medicine

## 2024-01-04 VITALS — BP 139/80 | HR 91 | Wt 298.0 lb

## 2024-01-04 DIAGNOSIS — H5213 Myopia, bilateral: Secondary | ICD-10-CM | POA: Diagnosis not present

## 2024-01-04 DIAGNOSIS — E669 Obesity, unspecified: Secondary | ICD-10-CM

## 2024-01-04 DIAGNOSIS — H524 Presbyopia: Secondary | ICD-10-CM | POA: Diagnosis not present

## 2024-01-04 NOTE — Progress Notes (Signed)
BP 139/80   Pulse 91   Wt 298 lb (135.2 kg)   LMP 12/20/2023   SpO2 100%   BMI 46.67 kg/m    Subjective:    Patient ID: Kellie [name], female    DOB: [birth year], 48 y.o.   MRN: [id number]  HPI: Kellie [name] is a 48 y.o. female presenting on 01/04/2024 for comprehensive medical examination. Current medical complaints include:none  OBESITY - Meds: none - Previously on none - Complications of obesity: none - Peak weight: 298lb - Weight loss to date: 0lb - Diet: 2 meals per day. Trying to get fruits and vegetables in. Previously did well on meal subscription diet plan. Doesn't think she is drinking enough water. - Exercise: none  Past medical history, surgical history, medications, allergies, family history and social history reviewed with patient today and changes made to appropriate areas of the chart.      Objective:    BP 139/80   Pulse 91   Wt 298 lb (135.2 kg)   LMP 12/20/2023   SpO2 100%   BMI 46.67 kg/m   Wt Readings from Last 3 Encounters:  01/04/24 298 lb (135.2 kg)  12/22/22 253 lb (114.8 kg)  05/31/21 277 lb 6.4 oz (125.8 kg)    Physical Exam Constitutional:      General: She is not in acute distress.    Appearance: Normal appearance. She is not toxic-appearing.  HENT:     Head: Normocephalic and atraumatic.     Right Ear: Tympanic membrane, ear canal and external ear normal.     Left Ear: Tympanic membrane, ear canal and external ear normal.     Nose: Nose normal.     Mouth/Throat:     Mouth: Mucous membranes are moist.     Pharynx: Oropharynx is clear.  Cardiovascular:     Rate and Rhythm: Normal rate and regular rhythm.     Heart sounds: Normal heart sounds. No murmur heard. Pulmonary:     Effort: Pulmonary effort is normal. No respiratory distress.     Breath sounds: Normal breath sounds.  Abdominal:     General: Bowel sounds are normal.     Palpations: Abdomen is soft.     Tenderness: There is no abdominal tenderness.  Musculoskeletal:         General: Normal range of motion.     Cervical back: Normal range of motion.     Right lower leg: No edema.     Left lower leg: No edema.  Lymphadenopathy:     Cervical: No cervical adenopathy.  Skin:    General: Skin is warm and dry.  Neurological:     Mental Status: She is alert and oriented to person, place, and time. Mental status is at baseline.     Gait: Gait normal.  Psychiatric:        Mood and Affect: Mood normal.        Behavior: Behavior normal.     Results for orders placed or performed during the hospital encounter of 12/14/20  ECHOCARDIOGRAM COMPLETE   Collection Time: 12/14/20  3:48 PM  Result Value Ref Range   S' Lateral 2.50 cm   Area-P 1/2 5.31 cm2      Assessment & Plan:   Problem List Items Addressed This Visit       Other   Obesity (BMI 35.0-39.9 without comorbidity) - Primary   Discussed diet and exercise, continue efforts. Recently had labs completed at Dermatology, will obtain records. Can  consider addition of GLP-1 agonist or HWW referral if remains morbidly obese and amenable despite good efforts.        Follow up plan: Return in about 1 year (around 01/03/2025) for CPE.   LABORATORY TESTING:  - Pap smear:  due, has appt with GYN next week  IMMUNIZATIONS:   - Tdap: Tetanus vaccination status reviewed: due. - Influenza: Up to date - Pneumococcal: Not applicable - HPV: Not applicable - Shingrix vaccine: Not applicable - COVID vaccine: due  SCREENING: - Mammogram:  due, gets [facility].   - Colonoscopy: Up to date  - Bone Density: Not applicable  - Lung Cancer Screening: Not applicable   Hep C Screening: UTD STD testing and prevention (HIV/chl/gon/syphilis): low risk  Osteoporosis: Discussed high calcium and vitamin D supplementation, weight bearing exercises  PATIENT COUNSELING:   Advised to take 1 mg of folate supplement per day if capable of pregnancy.   Sexuality: Discussed sexually transmitted diseases, partner selection, use  of condoms, avoidance of unintended pregnancy  and contraceptive alternatives.   Advised to avoid cigarette smoking.  I discussed with the patient that most people either abstain from alcohol or drink within safe limits (<=14/week and <=4 drinks/occasion for males, <=7/weeks and <= 3 drinks/occasion for females) and that the risk for alcohol disorders and other health effects rises proportionally with the number of drinks per week and how often a drinker exceeds daily limits.  Discussed cessation/primary prevention of drug use and availability of treatment for abuse.   Diet: Encouraged to adjust caloric intake to maintain  or achieve ideal body weight, to reduce intake of dietary saturated fat and total fat, to limit sodium intake by avoiding high sodium foods and not adding table salt, and to maintain adequate dietary potassium and calcium preferably from fresh fruits, vegetables, and low-fat dairy products.    stressed the importance of regular exercise  Injury prevention: Discussed safety belts, safety helmets, smoke detector, smoking near bedding or upholstery.   Dental health: Discussed importance of regular tooth brushing, flossing, and dental visits.    NEXT PREVENTATIVE PHYSICAL DUE IN 1 YEAR. Return in about 1 year (around 01/03/2025) for CPE.

## 2024-01-04 NOTE — Assessment & Plan Note (Addendum)
Discussed diet and exercise, continue efforts. Recently had labs completed at Dermatology, will obtain records. Can consider addition of GLP-1 agonist or HWW referral if remains morbidly obese and amenable despite good efforts.

## 2024-01-04 NOTE — Patient Instructions (Addendum)
It was great to see you!  Our plans for today:  - We will get you to sign a records release for your recent labs.  - See below for tips on eating healthy and moving your body. - Come back in 1 year for your next physical.  Take care and seek immediate care sooner if you develop any concerns.   Dr. Linwood Dibbles   Here is an example of what a healthy plate looks like:    ? Make half your plate fruits and vegetables.     ? Focus on whole fruits.     ? Vary your veggies.  ? Make half your grains whole grains. -     ? Look for the word "whole" at the beginning of the ingredients list    ? Some whole-grain ingredients include whole oats, whole-wheat flour,        whole-grain corn, whole-grain Keltz rice, and whole rye.  ? Move to low-fat and fat-free milk or yogurt.  ? Vary your protein routine. - Meat, fish, poultry (chicken, [place]), eggs, beans (kidney, pinto), dairy.  ? Drink and eat less sodium, saturated fat, and added sugars.  Look for opportunities to move your body throughout your day:  Never lie down when you can sit; never sit when you can stand; never stand when you can pace.  Moving your body throughout the day is just as important as the 30 or 60 minutes of exercise at the gym!  Get social Get active with your friends instead of going out to eat. Go for a hike, walk around the mall, or play an exercise-themed video game.   Move more at work Fit more activity into the workday. Stand during phone calls, use a printer farther from your desk, and get up to stretch each hour.    Do something new Develop a new skill to kick-start your motivation. Sign up for a class to learn how to [employer], surf, do tai chi, or play a sport.    Keep cool in the pool Don't like to sweat? Hit the local community pool for a swim, water polo, or water aerobics class to stay cool while exercising.    Stay on track Use a fitness tracker (FITBIT, Fitness Pal mobile app) to track your  activity and provide motivation to reach your goals.

## 2024-01-08 ENCOUNTER — Other Ambulatory Visit (HOSPITAL_BASED_OUTPATIENT_CLINIC_OR_DEPARTMENT_OTHER): Payer: Self-pay

## 2024-01-08 DIAGNOSIS — Z1231 Encounter for screening mammogram for malignant neoplasm of breast: Secondary | ICD-10-CM | POA: Diagnosis not present

## 2024-01-13 ENCOUNTER — Encounter: Payer: Self-pay | Admitting: Family Medicine

## 2024-01-14 DIAGNOSIS — R6882 Decreased libido: Secondary | ICD-10-CM | POA: Diagnosis not present

## 2024-01-14 DIAGNOSIS — Z01419 Encounter for gynecological examination (general) (routine) without abnormal findings: Secondary | ICD-10-CM | POA: Diagnosis not present

## 2024-01-19 ENCOUNTER — Other Ambulatory Visit (HOSPITAL_BASED_OUTPATIENT_CLINIC_OR_DEPARTMENT_OTHER): Payer: Self-pay

## 2024-01-22 DIAGNOSIS — Z131 Encounter for screening for diabetes mellitus: Secondary | ICD-10-CM | POA: Diagnosis not present

## 2024-01-22 DIAGNOSIS — Z6841 Body Mass Index (BMI) 40.0 and over, adult: Secondary | ICD-10-CM | POA: Diagnosis not present

## 2024-01-22 DIAGNOSIS — E559 Vitamin D deficiency, unspecified: Secondary | ICD-10-CM | POA: Diagnosis not present

## 2024-01-22 DIAGNOSIS — R5382 Chronic fatigue, unspecified: Secondary | ICD-10-CM | POA: Diagnosis not present

## 2024-01-22 DIAGNOSIS — E7800 Pure hypercholesterolemia, unspecified: Secondary | ICD-10-CM | POA: Diagnosis not present

## 2024-01-22 DIAGNOSIS — N951 Menopausal and female climacteric states: Secondary | ICD-10-CM | POA: Diagnosis not present

## 2024-01-22 DIAGNOSIS — R635 Abnormal weight gain: Secondary | ICD-10-CM | POA: Diagnosis not present

## 2024-01-27 DIAGNOSIS — N951 Menopausal and female climacteric states: Secondary | ICD-10-CM | POA: Diagnosis not present

## 2024-01-27 DIAGNOSIS — M2550 Pain in unspecified joint: Secondary | ICD-10-CM | POA: Diagnosis not present

## 2024-01-27 DIAGNOSIS — E7800 Pure hypercholesterolemia, unspecified: Secondary | ICD-10-CM | POA: Diagnosis not present

## 2024-01-27 DIAGNOSIS — E559 Vitamin D deficiency, unspecified: Secondary | ICD-10-CM | POA: Diagnosis not present

## 2024-01-27 DIAGNOSIS — Z1331 Encounter for screening for depression: Secondary | ICD-10-CM | POA: Diagnosis not present

## 2024-01-27 DIAGNOSIS — R5382 Chronic fatigue, unspecified: Secondary | ICD-10-CM | POA: Diagnosis not present

## 2024-01-27 DIAGNOSIS — Z1339 Encounter for screening examination for other mental health and behavioral disorders: Secondary | ICD-10-CM | POA: Diagnosis not present

## 2024-01-27 DIAGNOSIS — Z6841 Body Mass Index (BMI) 40.0 and over, adult: Secondary | ICD-10-CM | POA: Diagnosis not present

## 2024-01-27 DIAGNOSIS — R635 Abnormal weight gain: Secondary | ICD-10-CM | POA: Diagnosis not present

## 2024-02-22 ENCOUNTER — Other Ambulatory Visit (HOSPITAL_BASED_OUTPATIENT_CLINIC_OR_DEPARTMENT_OTHER): Payer: Self-pay

## 2024-03-03 ENCOUNTER — Encounter (INDEPENDENT_AMBULATORY_CARE_PROVIDER_SITE_OTHER): Payer: Self-pay | Admitting: Internal Medicine

## 2024-03-03 ENCOUNTER — Ambulatory Visit (HOSPITAL_BASED_OUTPATIENT_CLINIC_OR_DEPARTMENT_OTHER): Admitting: Student

## 2024-03-04 ENCOUNTER — Ambulatory Visit (HOSPITAL_BASED_OUTPATIENT_CLINIC_OR_DEPARTMENT_OTHER)

## 2024-03-04 ENCOUNTER — Ambulatory Visit (HOSPITAL_BASED_OUTPATIENT_CLINIC_OR_DEPARTMENT_OTHER): Admitting: Student

## 2024-03-04 ENCOUNTER — Encounter (HOSPITAL_BASED_OUTPATIENT_CLINIC_OR_DEPARTMENT_OTHER): Payer: Self-pay | Admitting: Student

## 2024-03-04 DIAGNOSIS — M25562 Pain in left knee: Secondary | ICD-10-CM

## 2024-03-04 DIAGNOSIS — G8929 Other chronic pain: Secondary | ICD-10-CM

## 2024-03-04 DIAGNOSIS — M1712 Unilateral primary osteoarthritis, left knee: Secondary | ICD-10-CM | POA: Diagnosis not present

## 2024-03-04 DIAGNOSIS — M7651 Patellar tendinitis, right knee: Secondary | ICD-10-CM | POA: Diagnosis not present

## 2024-03-04 DIAGNOSIS — M170 Bilateral primary osteoarthritis of knee: Secondary | ICD-10-CM

## 2024-03-04 DIAGNOSIS — M25561 Pain in right knee: Secondary | ICD-10-CM

## 2024-03-04 DIAGNOSIS — M1711 Unilateral primary osteoarthritis, right knee: Secondary | ICD-10-CM | POA: Diagnosis not present

## 2024-03-04 DIAGNOSIS — M25461 Effusion, right knee: Secondary | ICD-10-CM | POA: Diagnosis not present

## 2024-03-04 NOTE — Progress Notes (Signed)
 Chief Complaint: Bilateral knee pain    Discussed the use of AI scribe software for clinical note transcription with the patient, who gave verbal consent to proceed.  History of Present Illness Kellie [name] is a 48 year old female who presents with chronic knee pain. This has been present since a fall in 2013, primarily affecting the right knee. The pain is constant and accompanied by a cracking sensation during certain movements. She avoids stairs due to increased pain when climbing and experiences shooting pains and a sensation of loose ligaments when walking, although the knees do not buckle. Swelling occurs, particularly when moving after sitting. An MRI conducted in 2017 indicated fat pad impingement in the right knee. She has not received injections for the knee pain previously. The pain is exacerbated by weather changes and climbing stairs. She mentions that her weight might contribute to the knee pain. No posterior knee pain or buckling of the knees.  Surgical History:   None  PMH/PSH/Family History/Social History/Meds/Allergies:    Past Medical History:  Diagnosis Date   Allergy    Past Surgical History:  Procedure Laterality Date   ABLATION     CESAREAN SECTION     Social History   Socioeconomic History   Marital status: Married    Spouse name: Not on file   Number of children: Not on file   Years of education: Not on file   Highest education level: Not on file  Occupational History   Not on file  Tobacco Use   Smoking status: Never   Smokeless tobacco: Never  Vaping Use   Vaping status: Never Used  Substance and Sexual Activity   Alcohol use: Yes    Comment: socially   Drug use: No   Sexual activity: Not on file  Other Topics Concern   Not on file  Social History Narrative   Works at [employer] as Psychologist, educational.  Has a supportive husband and two kids (4 and 7)   Social Drivers of Corporate investment banker Strain: Not on  file  Food Insecurity: Not on file  Transportation Needs: Not on file  Physical Activity: Not on file  Stress: Not on file  Social Connections: Not on file   Family History  Problem Relation Age of Onset   Lupus Mother    Miscarriages / Stillbirths Maternal Grandfather    Heart disease Maternal Grandfather    Diabetes Paternal Grandmother    Hypertension Paternal Grandmother    Colon cancer Paternal Grandfather        dx in late 60's-early70's   Colon polyps Neg Hx    Esophageal cancer Neg Hx    Rectal cancer Neg Hx    Stomach cancer Neg Hx    Allergies  Allergen Reactions   Morphine And Codeine Itching   Current Outpatient Medications  Medication Sig Dispense Refill   clobetasol (TEMOVATE) 0.05 % external solution Apply topically to affected area(s) on scalp once daily as directed 50 mL 5   Vitamin D, Ergocalciferol, (DRISDOL) 1.25 MG (50000 UNIT) CAPS capsule Take 1 capsule (50,000 Units total) by mouth once a week. 4 capsule 11   Vitamin D, Ergocalciferol, (DRISDOL) 1.25 MG (50000 UNIT) CAPS capsule Take 1 capsule (50,000 Units total) by mouth once a week. 4 capsule 11   No  current facility-administered medications for this visit.   No results found.  Review of Systems:   A ROS was performed including pertinent positives and negatives as documented in the HPI.  Physical Exam :   Constitutional: NAD and appears stated age Neurological: Alert and oriented Psych: Appropriate affect and cooperative There were no vitals taken for this visit.   Comprehensive Musculoskeletal Exam:      Musculoskeletal Exam  Gait Normal  Alignment Normal   Right Left  Inspection Normal Normal  Palpation    Tenderness Medial joint line Medial joint line  Crepitus None None  Effusion Minimal Minimal  Range of Motion    Extension 0 0  Flexion 110 110  Strength    Extension 5/5 5/5  Flexion 5/5 5/5  Ligament Exam     Generalized Laxity No No  Valgus at 0 Negative Negative   Valgus at 20 Negative Negative  Varus at 0 0 0  Varus at 20   0 0  Vascular/Lymphatic Exam    Edema None None  Venous Stasis Changes No No  Distal Circulation Normal Normal  Neurologic    Light Touch Sensation Intact Intact  Special Tests:      Imaging:   Xray (right knee 4 views, left knee 4 views): Right knee demonstrates mild to moderate osteoarthritis within the medial patellofemoral compartments with presence of osteophytes.  Mild degenerative changes noted in the left knee within the medial and patellofemoral compartments.  No acute abnormality seen.   I personally reviewed and interpreted the radiographs.      Assessment & Plan Knee Osteoarthritis   Chronic bilateral knee pain with degenerative changes is more pronounced in the right knee.  This mainly affects the medial and patellofemoral compartments.  Symptoms include pain, crepitus, and difficulty with stairs and squatting. Offered knee cortisone injections to target arthritis for symptomatic relief and assess response.  She will consider this, and then return to clinic for injections should she wish to proceed.      I personally saw and evaluated the patient, and participated in the management and treatment plan.  Hazle Nordmann, PA-C Orthopedics

## 2024-03-10 ENCOUNTER — Encounter (INDEPENDENT_AMBULATORY_CARE_PROVIDER_SITE_OTHER): Payer: Self-pay

## 2024-03-23 ENCOUNTER — Telehealth (HOSPITAL_BASED_OUTPATIENT_CLINIC_OR_DEPARTMENT_OTHER): Payer: Self-pay | Admitting: Student

## 2024-03-23 NOTE — Telephone Encounter (Signed)
 LVM to let patient know that she can take anti inflammatory medication until her injections per [name] Daft

## 2024-03-23 NOTE — Telephone Encounter (Signed)
 Patient wants to if there is any kind of pain meds she can take for her knees before she tries an injection. Best contact number [phone] or send to her mychart

## 2024-04-06 ENCOUNTER — Other Ambulatory Visit (HOSPITAL_BASED_OUTPATIENT_CLINIC_OR_DEPARTMENT_OTHER): Payer: Self-pay

## 2024-05-17 ENCOUNTER — Other Ambulatory Visit (HOSPITAL_BASED_OUTPATIENT_CLINIC_OR_DEPARTMENT_OTHER): Payer: Self-pay

## 2024-06-07 ENCOUNTER — Encounter (INDEPENDENT_AMBULATORY_CARE_PROVIDER_SITE_OTHER): Payer: Self-pay | Admitting: Adult Health

## 2024-06-07 ENCOUNTER — Ambulatory Visit (INDEPENDENT_AMBULATORY_CARE_PROVIDER_SITE_OTHER): Admitting: Adult Health

## 2024-06-07 VITALS — BP 115/71 | HR 100 | Temp 99.2°F | Ht 67.5 in | Wt 302.0 lb

## 2024-06-07 DIAGNOSIS — Z0289 Encounter for other administrative examinations: Secondary | ICD-10-CM

## 2024-06-07 DIAGNOSIS — Z8249 Family history of ischemic heart disease and other diseases of the circulatory system: Secondary | ICD-10-CM | POA: Diagnosis not present

## 2024-06-07 DIAGNOSIS — Z833 Family history of diabetes mellitus: Secondary | ICD-10-CM | POA: Diagnosis not present

## 2024-06-07 DIAGNOSIS — Z6841 Body Mass Index (BMI) 40.0 and over, adult: Secondary | ICD-10-CM | POA: Diagnosis not present

## 2024-06-07 DIAGNOSIS — E559 Vitamin D deficiency, unspecified: Secondary | ICD-10-CM

## 2024-06-07 NOTE — Progress Notes (Signed)
 Office: [phone]  /  Fax: [phone]   Initial Visit    Kellie [name] Name was seen in clinic today to evaluate for obesity. She is interested in losing weight to improve overall health and reduce the risk of weight related complications. She presents today to review program treatment options, initial physical assessment, and evaluation.     She was referred by: Self-Referral  When asked what else they would like to accomplish? She states: Adopt a healthier eating pattern and lifestyle, Improve energy levels and physical activity, Improve existing medical conditions, and Improve quality of life  When asked how has your weight affected you? She states: Having fatigue, Having poor endurance, and Problems with eating patterns  Weight history: Weight gain since second/last pregnancy - youngest child is 17  Highest weight: 305 lbs  Some associated conditions: Vitamin D  Deficiency  Contributing factors: family history of obesity, disruption of circadian rhythm / sleep disordered breathing, consumption of processed foods, reduced physical activity, hectic pace of life, and need for convenient foods  Weight promoting medications identified: None  Prior weight loss attempts: Optifast  Current nutrition plan: None  Current level of physical activity: None  Current or previous pharmacotherapy: None  Response to medication: Never tried medications   Past medical history includes:   Past Medical History:  Diagnosis Date   Allergy      Objective    BP 115/71   Pulse 100   Temp 99.2 F (37.3 C)   Ht 5' 7.5 (1.715 m)   Wt (!) 302 lb (137 kg)   LMP  (LMP Unknown)   SpO2 99%   BMI 46.60 kg/m  She was weighed on the bioimpedance scale: Body mass index is 46.6 kg/m.  Body Fat%:52.8, Visceral Fat Rating:18, Weight trend over the last 12 months: Increasing  General:  Alert, oriented and cooperative. Patient is in no acute distress.  Respiratory: Normal respiratory effort, no  problems with respiration noted   Gait: able to ambulate independently  Mental Status: Normal mood and affect. Normal behavior. Normal judgment and thought content.   DIAGNOSTIC DATA REVIEWED:  BMET    Component Value Date/Time   NA 138 12/14/2020 1012   K 4.1 12/14/2020 1012   CL 99 12/14/2020 1012   CO2 24 12/14/2020 1012   GLUCOSE 89 12/14/2020 1012   GLUCOSE 85 06/10/2013 0923   BUN 11 12/14/2020 1012   CREATININE 0.80 12/14/2020 1012   CREATININE 0.74 06/10/2013 0923   CALCIUM 10.0 12/14/2020 1012   GFRNONAA 89 12/14/2020 1012   GFRAA 103 12/14/2020 1012   Lab Results  Component Value Date   HGBA1C 5.0 12/14/2020   No results found for: INSULIN  CBC    Component Value Date/Time   WBC 3.9 12/14/2020 1012   WBC 4.4 02/10/2014 0943   RBC 4.18 12/14/2020 1012   RBC 4.04 02/10/2014 0943   HGB 12.3 12/14/2020 1012   HCT 38.0 12/14/2020 1012   PLT 310 12/14/2020 1012   MCV 91 12/14/2020 1012   MCH 29.4 12/14/2020 1012   MCH 29.0 02/10/2014 0943   MCHC 32.4 12/14/2020 1012   MCHC 33.4 02/10/2014 0943   RDW 12.3 12/14/2020 1012   Iron/TIBC/Ferritin/ %Sat    Component Value Date/Time   FERRITIN 49 08/08/2011 1013   Lipid Panel     Component Value Date/Time   CHOL 183 12/14/2020 1012   TRIG 81 12/14/2020 1012   HDL 62 12/14/2020 1012   CHOLHDL 3.0 12/14/2020 1012   CHOLHDL  2.7 02/10/2014 0943   VLDL 9 02/10/2014 0943   LDLCALC 106 (H) 12/14/2020 1012   Hepatic Function Panel     Component Value Date/Time   PROT 7.4 06/10/2013 0923   ALBUMIN 4.3 06/10/2013 0923   AST 17 06/10/2013 0923   ALT 14 06/10/2013 0923   ALKPHOS 62 06/10/2013 0923   BILITOT 0.5 06/10/2013 0923      Component Value Date/Time   TSH 0.659 12/14/2020 1012     Assessment and Plan   Family history of premature CAD  Vitamin D  deficiency  Family history of diabetes mellitus  Morbid obesity (HCC), STARTING BMI 46.57    Assessment and Plan                 Obesity  Treatment / Action Plan:  Patient will work on garnering support from family and friends to begin weight loss journey. Will work on eliminating or reducing the presence of highly palatable, calorie dense foods in the home. Will complete provided nutritional and psychosocial assessment questionnaire before the next appointment. Will be scheduled for indirect calorimetry to determine resting energy expenditure in a fasting state.  This will allow us  to create a reduced calorie, high-protein meal plan to promote loss of fat mass while preserving muscle mass. Counseled on the health benefits of losing 5%-15% of total body weight. Was counseled on nutritional approaches to weight loss and benefits of reducing processed foods and consuming plant-based foods and high quality protein as part of nutritional weight management. Was counseled on pharmacotherapy and role as an adjunct in weight management.   Obesity Education Performed Today:  She was weighed on the bioimpedance scale and results were discussed and documented in the synopsis.  We discussed obesity as a disease and the importance of a more detailed evaluation of all the factors contributing to the disease.  We discussed the importance of long term lifestyle changes which include nutrition, exercise and behavioral modifications as well as the importance of customizing this to her specific health and social needs.  We discussed the benefits of reaching a healthier weight to alleviate the symptoms of existing conditions and reduce the risks of the biomechanical, metabolic and psychological effects of obesity.  We reviewed the four pillars of obesity medicine and importance of using a multimodal approach.  We reviewed the basic principles in weight management.   Kellie [name] appears to be in the action stage of change and states they are ready to start intensive lifestyle modifications and behavioral modifications.  I have spent 30 minutes  in the care of the patient today including: 5 minutes before the visit reviewing and preparing the chart. 20 minutes face-to-face assessing and reviewing listed medical problems as outlined in obesity care plan, providing nutritional and behavioral counseling on topics outlined in the obesity care plan, counseling regarding anti-obesity medication as outlined in obesity care plan, independently interpreting test results and goals of care, as described in assessment and plan, and reviewing and discussing biometric information and progress 5 minutes after the visit updating chart and documentation of encounter.  Reviewed by clinician on day of visit: allergies, medications, problem list, medical history, surgical history, family history, social history, and previous encounter notes pertinent to obesity diagnosis.  Jamiya Nims d. Ebbie Cherry, NP-C

## 2024-08-02 ENCOUNTER — Encounter (INDEPENDENT_AMBULATORY_CARE_PROVIDER_SITE_OTHER): Payer: Self-pay

## 2024-08-17 ENCOUNTER — Ambulatory Visit (INDEPENDENT_AMBULATORY_CARE_PROVIDER_SITE_OTHER): Admitting: Family Medicine

## 2024-08-17 ENCOUNTER — Encounter (INDEPENDENT_AMBULATORY_CARE_PROVIDER_SITE_OTHER): Payer: Self-pay | Admitting: Family Medicine

## 2024-08-17 ENCOUNTER — Other Ambulatory Visit (HOSPITAL_BASED_OUTPATIENT_CLINIC_OR_DEPARTMENT_OTHER): Payer: Self-pay

## 2024-08-17 VITALS — BP 142/88 | HR 99 | Temp 98.2°F | Ht 68.0 in | Wt 305.0 lb

## 2024-08-17 DIAGNOSIS — N926 Irregular menstruation, unspecified: Secondary | ICD-10-CM | POA: Diagnosis not present

## 2024-08-17 DIAGNOSIS — R29818 Other symptoms and signs involving the nervous system: Secondary | ICD-10-CM

## 2024-08-17 DIAGNOSIS — R030 Elevated blood-pressure reading, without diagnosis of hypertension: Secondary | ICD-10-CM | POA: Diagnosis not present

## 2024-08-17 DIAGNOSIS — E559 Vitamin D deficiency, unspecified: Secondary | ICD-10-CM | POA: Diagnosis not present

## 2024-08-17 DIAGNOSIS — E7800 Pure hypercholesterolemia, unspecified: Secondary | ICD-10-CM

## 2024-08-17 DIAGNOSIS — Z1331 Encounter for screening for depression: Secondary | ICD-10-CM | POA: Diagnosis not present

## 2024-08-17 DIAGNOSIS — R5383 Other fatigue: Secondary | ICD-10-CM | POA: Diagnosis not present

## 2024-08-17 DIAGNOSIS — N951 Menopausal and female climacteric states: Secondary | ICD-10-CM

## 2024-08-17 DIAGNOSIS — Z6841 Body Mass Index (BMI) 40.0 and over, adult: Secondary | ICD-10-CM

## 2024-08-17 DIAGNOSIS — R0602 Shortness of breath: Secondary | ICD-10-CM | POA: Diagnosis not present

## 2024-08-17 MED ORDER — ESTRADIOL 0.1 MG/GM VA CREA
1.0000 | TOPICAL_CREAM | Freq: Every day | VAGINAL | 1 refills | Status: AC
  Filled 2024-08-17: qty 42.5, 30d supply, fill #0

## 2024-08-17 NOTE — Progress Notes (Signed)
 Chief Complaint:  Obesity   Subjective:  Kellie [name] (MR# [id number]) is a 48 y.o. female who presents for evaluation and treatment of obesity and related comorbidities.   Kellie [name] is currently in the action stage of change and ready to dedicate time achieving and maintaining a healthier weight. Kellie [name] is interested in becoming our patient and working on intensive lifestyle modifications including (but not limited to) diet and exercise for weight loss.  Kellie [name] has been struggling with her weight. She has been unsuccessful in either losing weight, maintaining weight loss, or reaching her healthy weight goal.  Patient did online research.  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  She works 50-60 hours a week.  She is married and lives at home with her husband Kellie [name], daughter Kellie [name] (72) and son Kellie [name] (17).  Family is supportive but they don't eat meals together and her husband will be eating healthier with her.   Desired weight is 185lbs but no reason for that weight.  Reasons she gained weight is snacks, alcohol and inactivity.  Previously tried [place] weight loss- lost 60lbs in 2006/2007.  [facility] lost 45lbs in early 2010s, and [facility] 2 years ago and lost 45lbs.  Felt [facility] was easiest. Eats out 2-3 times a week at [employer], [employer].  Husband grocery shops and cooks.  Food dislikes are olives, tomatoes, mushrooms and beets.  Occasionally skips breakfast.   Food recall: 1 cup of coffee with 3 cream 1 splenda.  She also has 2 packs of graham crackers and 2 packs of peanut butter around 5:30a.  Something to break fast.  Cup of coffee and popcorn and lunch is between 11:30 and 12pm. Meat (1/2 cup), two vegetables (1/3 cup x 2) and a starch (1/3 cup).  Feels satisfied. May have a snack around 2pm- bored.  Will eat a kind bar and maybe pop corners.  Feels satisfied.  Eats again between 3:30-4pm and is hungry.   Leftovers like at lunch and seconds (half of lunch portion).  Fast food would be burger, fries and a drink. Eat and drink all and will feel be satisfied.  Will have another snack like smart pop or chips or occasionally peanuts (1/4 cup).    Indirect Calorimeter completed today shows a RMR: 2088.  Her calculated basal metabolic rate is 7855 thus her basal metabolic rate is worse than expected.  Other Fatigue Glena admits to daytime somnolence and admits to waking up still tired. Patient has a history of symptoms of Epworth sleepiness scale. Kellie [name] generally gets 7 hours of sleep per night, and states that she has generally restless sleep. Snoring is present. Apneic episodes are present. Epworth Sleepiness Score is 19.   Shortness of Breath Robyne notes increasing shortness of breath with exercising and seems to be worsening over time with weight gain. She notes getting out of breath sooner with activity than she used to. This has not gotten worse recently. Netra denies shortness of breath at rest or orthopnea.  Depression Screen Kellie [name]'s Food and Mood (modified PHQ-9) score was 18.     05/31/2021    8:43 AM  Depression screen PHQ 2/9  Decreased Interest 0  Down, Depressed, Hopeless 0  PHQ - 2 Score 0  Altered sleeping 0  Tired, decreased energy 0  Change in appetite 0  Feeling bad or failure about yourself  0  Trouble concentrating 0  Moving slowly  or fidgety/restless 0  Suicidal thoughts 0  PHQ-9 Score 0     Objective:  Vitals Temp: 98.2 F (36.8 C) BP: (!) 142/88 Pulse Rate: 99 SpO2: 98 %   Anthropometric Measurements Height: 5' 8 (1.727 m) Weight: (!) 305 lb (138.3 kg) BMI (Calculated): 46.39 Starting Weight: 305 lb   Body Composition  Body Fat %: 51.5 % Fat Mass (lbs): 157 lbs Muscle Mass (lbs): 140.6 lbs Total Body Water (lbs): 99.4 lbs Visceral Fat Rating : 17   Other Clinical Data RMR: 2088 Fasting: yes Labs: yes Today's Visit #: 1 Starting Date:  08/17/24 Comments: 1    EKG: Normal sinus rhythm, rate 82.  General: Cooperative, alert, well developed, in no acute distress. HEENT: Conjunctivae and lids unremarkable. Cardiovascular: Regular rhythm.  Lungs: Normal work of breathing. Neurologic: No focal deficits.   Lab Results  Component Value Date   CREATININE 0.80 12/14/2020   BUN 11 12/14/2020   NA 138 12/14/2020   K 4.1 12/14/2020   CL 99 12/14/2020   CO2 24 12/14/2020   Lab Results  Component Value Date   ALT 14 06/10/2013   AST 17 06/10/2013   ALKPHOS 62 06/10/2013   BILITOT 0.5 06/10/2013   Lab Results  Component Value Date   HGBA1C 5.0 12/14/2020   No results found for: INSULIN  Lab Results  Component Value Date   TSH 0.659 12/14/2020   Lab Results  Component Value Date   CHOL 183 12/14/2020   HDL 62 12/14/2020   LDLCALC 106 (H) 12/14/2020   TRIG 81 12/14/2020   CHOLHDL 3.0 12/14/2020   Lab Results  Component Value Date   WBC 3.9 12/14/2020   HGB 12.3 12/14/2020   HCT 38.0 12/14/2020   MCV 91 12/14/2020   PLT 310 12/14/2020   Lab Results  Component Value Date   FERRITIN 49 08/08/2011    Assessment and Plan:  Assessment & Plan Other fatigue  SOBOE (shortness of breath on exertion)  Depression screening  Vitamin D  deficiency Previously low Vitamin D  level.  No recent lab on file.  Repeat Vitamin D  level today. Pure hypercholesterolemia Previously elevated cholesterol levels.  Will repeat FLP today and risk stratify with results at next appointment. Irregular menstrual cycle  Elevated blood pressure reading BP elevated today.  No chest pain, chest pressure or headache.  She is not on BP medications.  Will follow up on BP at next appointment and decide if any treatment is necessary. Suspected sleep apnea Patient reports irregularity with her breathing at night.  Will refer to [facility] for sleep study for OSA eval. Menopausal symptoms Patient is interested in topical estrogen for  menopausal symptoms.  Discussed expected response.  Will follow up at next appointment to see if patient has noticed any response. Morbid obesity (HCC), STARTING BMI 46.57  BMI 45.0-49.9, adult (HCC)    Other Fatigue  Jaedynn does feel that her weight is causing her energy to be lower than it should be. Fatigue may be related to obesity, depression or many other causes. Labs will be ordered, and in the meanwhile, Jayla will focus on self care including making healthy food choices, increasing physical activity and focusing on stress reduction.  Shortness of Breath  Kanoelani does feel that she gets out of breath more easily that she used to when she exercises. Tiandra's shortness of breath appears to be obesity related and exercise induced. She has agreed to work on weight loss and gradually increase exercise to treat her exercise  induced shortness of breath. Will continue to monitor closely.   Problem List Items Addressed This Visit   None Visit Diagnoses       Other fatigue    -  Primary   Relevant Orders   EKG 12-Lead     SOBOE (shortness of breath on exertion)       Relevant Orders   EKG 12-Lead     Depression screening           Shakirra is currently in the action stage of change and her goal is to continue with weight loss efforts. I recommend Vaudie begin the structured treatment plan as follows:  She has agreed to Category 3 Plan  Exercise goals: All adults should avoid inactivity. Some activity is better than none, and adults who participate in any amount of physical activity, gain some health benefits.  Behavioral modification strategies:increasing lean protein intake, decreasing simple carbohydrates, increasing vegetables, meal planning and cooking strategies, and keeping healthy foods in the home  She was informed of the importance of frequent follow-up visits to maximize her success with intensive lifestyle modifications for her multiple health conditions. She was informed we  would discuss her lab results at her next visit unless there is a critical issue that needs to be addressed sooner. Aaleyah agreed to keep her next visit at the agreed upon time to discuss these results.  Labs ordered with plans to discuss at the next visit.   Attestation Statements:  Reviewed by clinician on day of visit: allergies, medications, problem list, medical history, surgical history, family history, social history, and previous encounter notes.  This is the patient's first visit at [facility]. The patient's NEW PATIENT PACKET was reviewed at length. Included in the packet: current and past health history, medications, allergies, ROS, gynecologic history (women only), surgical history, family history, social history, weight history, weight loss surgery history (for those that have had weight loss surgery), nutritional evaluation, mood and food questionnaire, PHQ9, Epworth questionnaire, sleep habits questionnaire, patient life and health improvement goals questionnaire. These will all be scanned into the patient's chart under media.   During the visit, I independently reviewed the patient's EKG, bioimpedance scale results, and indirect calorimeter results. I used this information to tailor a meal plan for the patient that will help her to lose weight and will improve her obesity-related conditions going forward. I performed a medically necessary appropriate examination and/or evaluation. I discussed the assessment and treatment plan with the patient. The patient was provided an opportunity to ask questions and all were answered. The patient agreed with the plan and demonstrated an understanding of the instructions. Labs were ordered at this visit and will be reviewed at the next visit unless more critical results need to be addressed immediately. Clinical information was updated and documented in the EMR.     Adelita Cho, MD

## 2024-08-26 DIAGNOSIS — N926 Irregular menstruation, unspecified: Secondary | ICD-10-CM | POA: Diagnosis not present

## 2024-08-26 DIAGNOSIS — E7800 Pure hypercholesterolemia, unspecified: Secondary | ICD-10-CM | POA: Diagnosis not present

## 2024-08-26 DIAGNOSIS — R030 Elevated blood-pressure reading, without diagnosis of hypertension: Secondary | ICD-10-CM | POA: Diagnosis not present

## 2024-08-26 DIAGNOSIS — R5383 Other fatigue: Secondary | ICD-10-CM | POA: Diagnosis not present

## 2024-08-26 DIAGNOSIS — E559 Vitamin D deficiency, unspecified: Secondary | ICD-10-CM | POA: Diagnosis not present

## 2024-08-27 LAB — COMPREHENSIVE METABOLIC PANEL WITH GFR
ALT: 24 IU/L (ref 0–32)
AST: 24 IU/L (ref 0–40)
Albumin: 4.6 g/dL (ref 3.9–4.9)
Alkaline Phosphatase: 94 IU/L (ref 41–116)
BUN/Creatinine Ratio: 22 (ref 9–23)
BUN: 16 mg/dL (ref 6–24)
Bilirubin Total: 0.3 mg/dL (ref 0.0–1.2)
CO2: 23 mmol/L (ref 20–29)
Calcium: 11.7 mg/dL — ABNORMAL HIGH (ref 8.7–10.2)
Chloride: 99 mmol/L (ref 96–106)
Creatinine, Ser: 0.72 mg/dL (ref 0.57–1.00)
Globulin, Total: 3.1 g/dL (ref 1.5–4.5)
Glucose: 79 mg/dL (ref 70–99)
Potassium: 4.7 mmol/L (ref 3.5–5.2)
Sodium: 138 mmol/L (ref 134–144)
Total Protein: 7.7 g/dL (ref 6.0–8.5)
eGFR: 102 mL/min/1.73 (ref >59)

## 2024-08-27 LAB — CBC WITH DIFFERENTIAL/PLATELET
Basophils Absolute: 0 x10E3/uL (ref 0.0–0.2)
Basos: 1 %
EOS (ABSOLUTE): 0 x10E3/uL (ref 0.0–0.4)
Eos: 1 %
Hematocrit: 36.8 % (ref 34.0–46.6)
Hemoglobin: 11.6 g/dL (ref 11.1–15.9)
Immature Grans (Abs): 0 x10E3/uL (ref 0.0–0.1)
Immature Granulocytes: 0 %
Lymphocytes Absolute: 1.5 x10E3/uL (ref 0.7–3.1)
Lymphs: 34 %
MCH: 28.7 pg (ref 26.6–33.0)
MCHC: 31.5 g/dL (ref 31.5–35.7)
MCV: 91 fL (ref 79–97)
Monocytes Absolute: 0.3 x10E3/uL (ref 0.1–0.9)
Monocytes: 8 %
Neutrophils Absolute: 2.5 x10E3/uL (ref 1.4–7.0)
Neutrophils: 55 %
Platelets: 310 x10E3/uL (ref 150–450)
RBC: 4.04 x10E6/uL (ref 3.77–5.28)
RDW: 12.9 % (ref 11.7–15.4)
WBC: 4.4 x10E3/uL (ref 3.4–10.8)

## 2024-08-27 LAB — FOLATE: Folate: 5.6 ng/mL (ref >3.0)

## 2024-08-27 LAB — LIPID PANEL WITH LDL/HDL RATIO
Cholesterol, Total: 177 mg/dL (ref 100–199)
HDL: 56 mg/dL (ref >39)
LDL Chol Calc (NIH): 109 mg/dL — ABNORMAL HIGH (ref 0–99)
LDL/HDL Ratio: 1.9 ratio (ref 0.0–3.2)
Triglycerides: 64 mg/dL (ref 0–149)
VLDL Cholesterol Cal: 12 mg/dL (ref 5–40)

## 2024-08-27 LAB — HEMOGLOBIN A1C
Est. average glucose Bld gHb Est-mCnc: 111 mg/dL
Hgb A1c MFr Bld: 5.5 % (ref 4.8–5.6)

## 2024-08-27 LAB — VITAMIN B12: Vitamin B-12: 816 pg/mL (ref 232–1245)

## 2024-08-27 LAB — TSH: TSH: 0.983 u[IU]/mL (ref 0.450–4.500)

## 2024-08-27 LAB — INSULIN, RANDOM: INSULIN: 17.2 u[IU]/mL (ref 2.6–24.9)

## 2024-08-27 LAB — VITAMIN D 25 HYDROXY (VIT D DEFICIENCY, FRACTURES): Vit D, 25-Hydroxy: 28.8 ng/mL — ABNORMAL LOW (ref 30.0–100.0)

## 2024-08-27 LAB — T3: T3, Total: 102 ng/dL (ref 71–180)

## 2024-08-27 LAB — T4, FREE: Free T4: 1.28 ng/dL (ref 0.82–1.77)

## 2024-08-31 ENCOUNTER — Ambulatory Visit (INDEPENDENT_AMBULATORY_CARE_PROVIDER_SITE_OTHER): Admitting: Family Medicine

## 2024-08-31 ENCOUNTER — Encounter (INDEPENDENT_AMBULATORY_CARE_PROVIDER_SITE_OTHER): Payer: Self-pay | Admitting: Family Medicine

## 2024-08-31 VITALS — BP 114/82 | HR 104 | Temp 98.4°F | Ht 68.0 in | Wt 299.0 lb

## 2024-08-31 DIAGNOSIS — E559 Vitamin D deficiency, unspecified: Secondary | ICD-10-CM | POA: Diagnosis not present

## 2024-08-31 DIAGNOSIS — E7800 Pure hypercholesterolemia, unspecified: Secondary | ICD-10-CM | POA: Diagnosis not present

## 2024-08-31 DIAGNOSIS — Z6841 Body Mass Index (BMI) 40.0 and over, adult: Secondary | ICD-10-CM | POA: Diagnosis not present

## 2024-08-31 DIAGNOSIS — E88819 Insulin resistance, unspecified: Secondary | ICD-10-CM | POA: Diagnosis not present

## 2024-08-31 NOTE — Progress Notes (Signed)
 SUBJECTIVE:  Chief Complaint: Obesity  Interim History: Patient felt the meal plan offered more than enough food.  She voices she controlled her snacks and stayed within her snack calories.  She is wondering about whether or not there is substitution for salad dressing.  She finds that she can eat like 5oz of meat at night with 2 cups of vegetables or will eat 8oz of meat and then limit her vegetable intake.  Patient mentions [employer] homecoming is coming up and they she has a birthday party she is attending.  Besides that she has nothing else in terms of events, celebrations or activities.  Kellie [name] is here to discuss her progress with her obesity treatment plan. She is on the Category 3 Plan and states she is following her eating plan approximately 90 % of the time. She states she is exercising 10 minutes 1 time per week.   OBJECTIVE: Visit Diagnoses: Problem List Items Addressed This Visit   None Visit Diagnoses       Vitamin D  deficiency    -  Primary     Pure hypercholesterolemia         Insulin  resistance         Morbid obesity (HCC), STARTING BMI 46.57         BMI 45.0-49.9, adult (HCC)         Hypercalcemia       Relevant Orders   Ambulatory referral to Endocrinology   PTH, Intact and Calcium   US  THYROID        Vitals Temp: 98.4 F (36.9 C) BP: 114/82 Pulse Rate: (!) 104 SpO2: 100 %   Anthropometric Measurements Height: 5' 8 (1.727 m) Weight: 299 lb (135.6 kg) BMI (Calculated): 45.47 Weight at Last Visit: 305 lb Weight Lost Since Last Visit: 6 Weight Gained Since Last Visit: 0 Starting Weight: 305 lb Total Weight Loss (lbs): 6 lb (2.722 kg) Peak Weight: 0   Body Composition  Body Fat %: 51.9 % Fat Mass (lbs): 155.2 lbs Muscle Mass (lbs): 136.8 lbs Total Body Water (lbs): 100.2 lbs Visceral Fat Rating : 17   Other Clinical Data Today's Visit #: 2 Starting Date: 08/17/24 Comments: Cat 3     ASSESSMENT AND PLAN: Assessment & Plan Vitamin D   deficiency Discussed importance of vitamin d  supplementation.  Vitamin d  supplementation has been shown to decrease fatigue, decrease risk of progression to insulin  resistance and then prediabetes, decreases risk of falling in older age and can even assist in decreasing depressive symptoms in PTSD.   Prescription for Vitamin D  sent in.   Pure hypercholesterolemia The 10-year ASCVD risk score (Arnett DK, et al., 2019) is: 0.9%   Values used to calculate the score:     Age: 48 years     Clincally relevant sex: Female     Is Non-Hispanic African American: Yes     Diabetic: No     Tobacco smoker: No     Systolic Blood Pressure: 114 mmHg     Is BP treated: No     HDL Cholesterol: 56 mg/dL     Total Cholesterol: 177 mg/dL  Insulin  resistance Pathophysiology of progression through insulin  resistance to prediabetes and diabetes was discussed at length today.  Patient to continue to monitor and be in control of total intake of snack calories which may be simple carbohydrates but should be consumed only after the patient has taken in all the nutrition for the day.  Macronutrient identification, classification and daily intake ratios were  discussed.  Plan to repeat labs in 3 months to monitor both hemoglobin A1c and insulin  levels.  No medications at this time as patient is not having significant hunger or cravings that would make following meal plan more difficult.    Morbid obesity (HCC), STARTING BMI 46.57  BMI 45.0-49.9, adult (HCC)  Hypercalcemia Prior calcium level in epic of 10.0 with a printout of labs from last year showing calcium level of 10.6.  Calcium has increased since that time to 11.7.  This is significantly above the range for this lab.  Will repeat calcium and order a parathyroid level today.  Patient will also be referred to endocrine and a thyroid  ultrasound has been ordered.  Patient reports that at work previously she was allowing an ultrasound Engineering geologist to perform an  ultrasound on her thyroid  and a hyperechoic lesion was seen.  Will follow-up at next appointment as to when the prior appointments to endocrine and for ultrasound has been scheduled.   Diet: Kellie [name] is currently in the action stage of change. As such, her goal is to continue with weight loss efforts and has agreed to the Category 3 Plan.   Exercise:  For additional and more extensive health benefits, adults should increase their aerobic physical activity to 300 minutes (5 hours) a week of moderate-intensity, or 150 minutes a week of vigorous-intensity aerobic physical activity, or an equivalent combination of moderate- and vigorous-intensity activity. Additional health benefits are gained by engaging in physical activity beyond this amount.   Behavior Modification:  We discussed the following Behavioral Modification Strategies today: increasing lean protein intake, decreasing simple carbohydrates, increasing vegetables, meal planning and cooking strategies, and planning for success.   Return in about 3 weeks (around 09/21/2024).   She was informed of the importance of frequent follow up visits to maximize her success with intensive lifestyle modifications for her multiple health conditions.  Attestation Statements:   Reviewed by clinician on day of visit: allergies, medications, problem list, medical history, surgical history, family history, social history, and previous encounter notes.  Adelita Cho, MD

## 2024-09-01 LAB — PTH, INTACT AND CALCIUM
Calcium: 11.3 mg/dL — ABNORMAL HIGH (ref 8.7–10.2)
PTH: 56 pg/mL (ref 15–65)

## 2024-09-06 ENCOUNTER — Other Ambulatory Visit

## 2024-09-22 ENCOUNTER — Encounter (INDEPENDENT_AMBULATORY_CARE_PROVIDER_SITE_OTHER): Payer: Self-pay | Admitting: Family Medicine

## 2024-10-03 ENCOUNTER — Encounter: Payer: Self-pay | Admitting: Radiology

## 2024-10-05 ENCOUNTER — Ambulatory Visit (INDEPENDENT_AMBULATORY_CARE_PROVIDER_SITE_OTHER): Payer: Self-pay | Admitting: Family Medicine

## 2024-10-05 ENCOUNTER — Encounter (INDEPENDENT_AMBULATORY_CARE_PROVIDER_SITE_OTHER): Payer: Self-pay | Admitting: Family Medicine

## 2024-10-05 VITALS — BP 128/84 | HR 98 | Temp 98.6°F | Ht 68.0 in | Wt 294.0 lb

## 2024-10-05 DIAGNOSIS — E215 Disorder of parathyroid gland, unspecified: Secondary | ICD-10-CM | POA: Diagnosis not present

## 2024-10-05 DIAGNOSIS — Z6841 Body Mass Index (BMI) 40.0 and over, adult: Secondary | ICD-10-CM | POA: Diagnosis not present

## 2024-10-05 NOTE — Progress Notes (Signed)
   SUBJECTIVE:  Chief Complaint: Obesity  Interim History: Patient here for 1 month follow up.  Weekends tend to be difficult because she isn't working consistently.  She is working on the weekends now so she will be able to pack her lunch.  Patient has not been hungry.  She is working third shift and has not been as consistent.  She is planning a seafood boil for Thanksgiving.  Not able to get all food in at supper but is getting in vegetables and meat just not two cups.   Kellie [name] is here to discuss her progress with her obesity treatment plan. She is on the Category 3 Plan and states she is following her eating plan approximately 75% of the time. She states she is walking.  OBJECTIVE: Visit Diagnoses: Problem List Items Addressed This Visit   None   Vitals Temp: 98.6 F (37 C) BP: 128/84 Pulse Rate: 98 SpO2: 98 %   Anthropometric Measurements Height: 5' 8 (1.727 m) Weight: 294 lb (133.4 kg) BMI (Calculated): 44.71 Weight at Last Visit: 299 lb Weight Lost Since Last Visit: 5 Weight Gained Since Last Visit: 0 Starting Weight: 305 lb Total Weight Loss (lbs): 11 lb (4.99 kg)   Body Composition  Body Fat %: 51.4 % Fat Mass (lbs): 151.2 lbs Muscle Mass (lbs): 135.6 lbs Total Body Water (lbs): 100.8 lbs Visceral Fat Rating : 17   Other Clinical Data Today's Visit #: 3 Starting Date: 08/17/24 Comments: Cat 3     ASSESSMENT AND PLAN: Assessment & Plan Parathyroid abnormality Given results of ultrasound will order nuclear parathyroid scan and refer to General surgery for further evaluation.  Will need to follow up on the scan and results as well as evaluation by surgery. Morbid obesity (HCC), STARTING BMI 46.57  BMI 40.0-44.9, adult (HCC)    Diet: Linzee is currently in the action stage of change. As such, her goal is to continue with weight loss efforts and has agreed to the Category 3 Plan.   Exercise:  All adults should avoid inactivity. Some activity is  better than none, and adults who participate in any amount of physical activity, gain some health benefits.  Behavior Modification:  We discussed the following Behavioral Modification Strategies today: increasing lean protein intake, decreasing simple carbohydrates, increasing vegetables, meal planning and cooking strategies, and planning for success.   No follow-ups on file.   She was informed of the importance of frequent follow up visits to maximize her success with intensive lifestyle modifications for her multiple health conditions.  Attestation Statements:   Reviewed by clinician on day of visit: allergies, medications, problem list, medical history, surgical history, family history, social history, and previous encounter notes.     Adelita Cho, MD

## 2024-10-06 ENCOUNTER — Telehealth: Payer: Self-pay | Admitting: Neurology

## 2024-10-06 NOTE — Telephone Encounter (Signed)
 Called patient to reschedule 10/10/24 appt due to MD being out, she answered and asked me to wait for a second. I waited for three minutes before having to hang up due to another patient needing to check out. Sent mychart msg asking pt to call back to reschedule  If patient calls back you can offer her 11/10 at 3pm with Dr Chalice

## 2024-10-10 ENCOUNTER — Institutional Professional Consult (permissible substitution): Admitting: Neurology

## 2024-10-17 ENCOUNTER — Other Ambulatory Visit (HOSPITAL_BASED_OUTPATIENT_CLINIC_OR_DEPARTMENT_OTHER): Payer: Self-pay

## 2024-10-17 DIAGNOSIS — L219 Seborrheic dermatitis, unspecified: Secondary | ICD-10-CM | POA: Diagnosis not present

## 2024-10-17 MED ORDER — KETOCONAZOLE 2 % EX SHAM
MEDICATED_SHAMPOO | CUTANEOUS | 11 refills | Status: AC
  Filled 2024-10-17: qty 120, 30d supply, fill #0

## 2024-10-17 MED ORDER — CLOBETASOL PROPIONATE 0.05 % EX OINT
TOPICAL_OINTMENT | CUTANEOUS | 11 refills | Status: AC
  Filled 2024-10-17: qty 15, 30d supply, fill #0

## 2024-10-19 ENCOUNTER — Other Ambulatory Visit (HOSPITAL_BASED_OUTPATIENT_CLINIC_OR_DEPARTMENT_OTHER): Payer: Self-pay

## 2024-11-02 ENCOUNTER — Ambulatory Visit (INDEPENDENT_AMBULATORY_CARE_PROVIDER_SITE_OTHER): Payer: Self-pay | Admitting: Family Medicine

## 2024-11-02 VITALS — BP 114/80 | HR 90 | Temp 98.5°F | Ht 68.0 in | Wt 290.0 lb

## 2024-11-02 DIAGNOSIS — Z6841 Body Mass Index (BMI) 40.0 and over, adult: Secondary | ICD-10-CM | POA: Diagnosis not present

## 2024-11-02 DIAGNOSIS — E215 Disorder of parathyroid gland, unspecified: Secondary | ICD-10-CM

## 2024-11-02 NOTE — Progress Notes (Unsigned)
° °  SUBJECTIVE:  Chief Complaint: Obesity  Interim History: Patient here for follow up. Thanksgiving was seafood instead of typical Thanksgiving.  She is finding if she doesn't plan ahead things fall apart.  She mentions that she does different shifts and if she doesn't have food prepared she will end up getting food that she is trying to stay healthy for the options that are available.  Feels like when she gets refocused she will be able to get more consistently on plan.  Interested in soup recipes.  Kellie [name] is here to discuss her progress with her obesity treatment plan. She is on the Category 3 Plan and states she is following her eating plan approximately 25 % of the time. She states she is not exercising.   OBJECTIVE: Visit Diagnoses: Problem List Items Addressed This Visit   None   Vitals Temp: 98.5 F (36.9 C) BP: 114/80 Pulse Rate: 90 SpO2: 100 %   Anthropometric Measurements Height: 5' 8 (1.727 m) Weight: 290 lb (131.5 kg) BMI (Calculated): 44.1 Weight at Last Visit: 294 lb Weight Lost Since Last Visit: 4 Starting Weight: 305 lb Total Weight Loss (lbs): 15 lb (6.804 kg)   Body Composition  Body Fat %: 51 % Fat Mass (lbs): 148 lbs Muscle Mass (lbs): 135 lbs Total Body Water (lbs): 99.8 lbs Visceral Fat Rating : 16   Other Clinical Data Today's Visit #: 4 Starting Date: 08/17/24 Comments: Cat 3     ASSESSMENT AND PLAN: Assessment & Plan Parathyroid abnormality Appointment tomorrow with general surgery to evaluate treatment plan for PTH and elevated calcium.  Ultrasound of thyroid  that was done in between appointments showed oval elongated hyperechoic nodule with vascularity measuring 2.4 x 1.0 x 1.4 cm that was noted to be suspicious for enlarged left superior parathyroid gland/adenoma.  Will hold off on nuclear scan until patient sees general surgery.  Follow-up on notes after tomorrow's appointment.  Repeat calcium done at last appointment still  elevated. BMI 40.0-44.9, adult (HCC)  Morbid obesity (HCC), STARTING BMI 46.57    Diet: Kellie [name] is currently in the action stage of change. As such, her goal is to continue with weight loss efforts and has agreed to the Category 3 Plan.   Exercise:  All adults should avoid inactivity. Some activity is better than none, and adults who participate in any amount of physical activity, gain some health benefits.  Behavior Modification:  We discussed the following Behavioral Modification Strategies today: increasing lean protein intake, decreasing simple carbohydrates, increasing vegetables, meal planning and cooking strategies, keeping healthy foods in the home, and planning for success.   Return in about 5 weeks (around 12/07/2024).   She was informed of the importance of frequent follow up visits to maximize her success with intensive lifestyle modifications for her multiple health conditions.  Attestation Statements:   Reviewed by clinician on day of visit: allergies, medications, problem list, medical history, surgical history, family history, social history, and previous encounter notes.     Adelita Cho, MD

## 2024-11-03 DIAGNOSIS — E210 Primary hyperparathyroidism: Secondary | ICD-10-CM | POA: Diagnosis not present

## 2024-11-07 ENCOUNTER — Other Ambulatory Visit (HOSPITAL_BASED_OUTPATIENT_CLINIC_OR_DEPARTMENT_OTHER): Payer: Self-pay

## 2024-11-07 ENCOUNTER — Encounter: Payer: Self-pay | Admitting: Neurology

## 2024-11-07 ENCOUNTER — Ambulatory Visit: Admitting: Neurology

## 2024-11-07 ENCOUNTER — Other Ambulatory Visit: Payer: Self-pay

## 2024-11-07 VITALS — BP 132/89 | HR 81 | Ht 67.0 in | Wt 299.0 lb

## 2024-11-07 DIAGNOSIS — G4726 Circadian rhythm sleep disorder, shift work type: Secondary | ICD-10-CM | POA: Diagnosis not present

## 2024-11-07 DIAGNOSIS — R0689 Other abnormalities of breathing: Secondary | ICD-10-CM | POA: Diagnosis not present

## 2024-11-07 DIAGNOSIS — G4719 Other hypersomnia: Secondary | ICD-10-CM | POA: Diagnosis not present

## 2024-11-07 DIAGNOSIS — E662 Morbid (severe) obesity with alveolar hypoventilation: Secondary | ICD-10-CM | POA: Insufficient documentation

## 2024-11-07 DIAGNOSIS — E66813 Obesity, class 3: Secondary | ICD-10-CM

## 2024-11-07 DIAGNOSIS — Z6841 Body Mass Index (BMI) 40.0 and over, adult: Secondary | ICD-10-CM | POA: Diagnosis not present

## 2024-11-07 DIAGNOSIS — R0683 Snoring: Secondary | ICD-10-CM | POA: Diagnosis not present

## 2024-11-07 MED ORDER — ARMODAFINIL 250 MG PO TABS
250.0000 mg | ORAL_TABLET | Freq: Every day | ORAL | 5 refills | Status: AC
  Filled 2024-11-07: qty 30, 30d supply, fill #0

## 2024-11-07 NOTE — Progress Notes (Signed)
 @[facility]   Provider:  Dedra Gores, MD  Primary Care Physician:  Madelon Donald HERO, DO [address] [place] [address]  Referring Provider: Berkeley Adelita PENNER, Md [address] [place],  [place] [address]        Chief Concern for this Consultation:   Patient presents with          HPI: I have the pleasure of meeting with Kellie [name] , on 11/07/24 , who is a 48 y.o. female patient, seen upon a referral by  DR Berkeley , for a  Sleep Medicine Consultation.   The patient's referral information asked for an evaluation based on excessive daytime sleepiness,  shift work at [employer], and snoring with witnessed apneas - per husband, who has been treated by [facility] Dr Trent myasthenia gravis.  Her son is 68 and ready to graduate HS next year.    Chief concern according to patient:  I am just too tired, I work all the times and all times.    She  presented with a medical history of  Past Medical History:  Diagnosis Date   Allergy- not respiratory , itching and hives.     Arthritis of knees, Osteo    Bilateral   Low vitamin D  level Morbid obesity for most of her adult life.  Graduation from college, pregnancy > 20 years of weight gain.        Sleep relevant medical/ surgical and symptom history: shift worker, physically less active with knee pain, The patient reports onset of symptoms over a time period of 5-6.  See ROS.  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    This patient had a previous sleep study/ studies in the year 2007 at [facility] with a resulting diagnosis of mildest OSA. This patient has used the following therapies: /   Family medical history: There are  biological family members affected by Sleep apnea ( none ), or Insomnia (mother ) , by excessive daytime sleepiness (/).     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 The patient currently works shifts for 26 years, The workplace involves not much  physical activity, no outdoor activity,no  travel.  Nicotine use: ?/.  ETOH use: one drink a week,  Caffeine intake in form of: Coffee (1 cup in AM ), Exercises not regularly.    Sleep habits and routines are as follows: The patient's dinner time is around 3-4 PM.  Evening time is spent by housework. The patient goes to bed at, or close to, 7-8 PM. The bedroom is shared with husband  and is described as cool, but not quiet, and not dark. TV is on !!  The patient reports that it takes <5 minutes to fall asleep, then continues to sleep for 6-7 hours, uninterrupted or woken up by the need to void (Nocturia times 2-3 ).   The preferred sleep position is supine, with support of 2 pillows, (non- adjustable bed/ flat ).  The total estimated sleep time is circa 6-7 hours.  Dreams are reportedly  frequent/ and can be vivid. Dream enactment has been reported.  Sleep fighting, and talking.  3.30 AM is the usual week- day rise time.  The patient wakes up  spontaneously/ with an alarm set at 3.30AM.  Kellie [name] reports not feeling refreshed and restored in the morning, waking with symptoms such as dry mouth, morning headaches, stiffness in shoulders or knee pain, and fatigue.   No sleep paralysis has been experienced- once when she took nyquil..  Naps in daytime are taken infrequently (there is a desire to nap and opportunity  she sleeps once she rests- and thus avoids resting), lasting from 30 to 180 minutes and have a refreshing quality.  Shorter naps make her feel angry and  frustrated.  These long naps  do not interfere with nocturnal sleep.    Review of Systems: Out of a complete 14 system review, the patient complains of only the following symptoms, and all other reviewed systems are negative.:   Hypersomnia- 20/ 24    Insomnia / Depression/ anxiety not currently   Pain: knee ,  morning  Headaches , dry mouth snoring.     Snoring, , Nocturia 2 - 3 times   Shift worker    How likely are you to doze in the following situations: 0 = not likely, 1 = slight chance, 2 = moderate chance, 3 = high chance Sitting and Reading? Watching Television? Sitting inactive in a public place (theater or meeting)? As a passenger in a car for an hour without a break? Lying down in the afternoon when circumstances permit? Sitting and talking to someone? Sitting quietly after lunch without alcohol? In a car, while stopped for a few minutes in traffic?   Total ESS =20 / 24 points.    FSS endorsed at 49/ 63 points.  Drooling every night - can't breathe through her nose.  Choking.  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  Has a supportive husband and two kids (4 and 7)   Social Drivers of Corporate Investment Banker Strain: Not on file  Food Insecurity: Not on file  Transportation Needs: Not on file  Physical Activity: Not on file  Stress: Not on file  Social Connections: Not on file    Family History  Problem Relation Age of Onset   Hypertension Mother    Lupus Mother    Kidney disease Mother    Anxiety disorder Mother    Obesity Mother    Obesity Father    Hypertension Father    Miscarriages / Stillbirths Maternal Grandfather    Heart  disease Maternal Grandfather    Diabetes Paternal Grandmother    Hypertension Paternal Grandmother    Colon cancer Paternal Grandfather        dx in late 60's-early70's   Colon polyps Neg Hx    Esophageal cancer Neg Hx    Rectal cancer Neg Hx    Stomach cancer Neg Hx     Past Medical History:  Diagnosis Date   Allergy  Arthritis of knee    Bilateral   Low vitamin D  level     Past Surgical History:  Procedure Laterality Date   ABLATION     CESAREAN SECTION       Current Outpatient Medications on File Prior to Visit  Medication Sig Dispense Refill   Vitamin D , Ergocalciferol , (DRISDOL ) 1.25 MG (50000 UNIT) CAPS capsule Take 1 capsule (50,000 Units total) by mouth once a week. 4 capsule 11   clobetasol  ointment (TEMOVATE ) 0.05 % Apply to scalp every other day (Patient not taking: Reported on 11/07/2024) 60 g 11   estradiol  (ESTRACE ) 0.1 MG/GM vaginal cream Place 1 Applicatorful vaginally at bedtime for 2 weeks then twice a week (Patient not taking: Reported on 11/07/2024) 42.5 g 1   ketoconazole  (NIZORAL ) 2 % shampoo Use to wash scalp every 2 weeks. Apply to damp skin, lather, leave on 5 minutes, and rinse (Patient not taking: Reported on 11/07/2024) 120 mL 11   No current facility-administered medications on file prior to visit.    Allergies  Allergen Reactions   Morphine And Codeine Itching    Vitals:   11/07/24 0843  BP: 132/89  Pulse: 81      Physical exam:   General: The patient was alert and appears not in acute distress.  Mood and affect are appropriate .  The patient's interactions are: Cooperative, makes eye contact, follows the instructions and answers questions coherently.  The patient is groomed and appropriately groomed and dressed. Head: Normocephalic, atraumatic.  Neck is supple. Mallampati: 3 plus   mouth breathing .  The neck circumference measured 17.5 inches. Nasal airflow was  patent ,  but obstructs at night  Overbite / Retrognathia was  noted.  Dental status: biological. Cardiovascular:  Regular rate and cardiac rhythm by palpable pulse. Respiratory: no audible wheezing, no tachypnoea.   Skin:  Without evidence of ankle edema. No discoloration.  Trunk:  BMI is 46.8 truncal obesity  The patient's posture was erect.   Neurologic exam : The patient was awake and alert, oriented to place and time.   Attention span & concentration ability appeared normal.  Speech was fluent, without dysarthria, dysphonia or aphasia, and of normal volume.     Cranial nerves:  There was no loss of smell or taste reported  Pupils are round, equal in size and briskly reactive to light.  Funduscopic exam was deferred.  Extraocular movements in vertical and horizontal planes were intact and without nystagmus. (No Diplopia reported). Visual fields by finger perimetry are intact. Hearing was intact to soft voice.   Facial sensation intact to fine touch. Facial motor strength: Symmetric movement and tongue and uvula move midline.  Neck ROM: rotation, tilt and flexion extension were intact for age and shoulder shrug was symmetrical.    Motor exam:  Symmetric bulk, strength and ROM.   Normal tone.   Sensory:  Fine touch and vibration :intact.  Proprioception tested in the upper extremities was normal.   Coordination: The patient reported no problems with button closure and no changes to penmanship.     Gait and station: Patient could rise unassisted from a seated position, without bracing, and walked without assistive device.    I would like to thank Rumball, Donald HERO, DO and Berkeley Adelita PENNER, Md [address] [place],  [place] [address] for allowing me to meet with Kellie [name]    In short, Kellie [name] is presenting with excessive daytime sleepiness , high risk for OSA  and OHV, and has poor nasal airway patency- explaining snoring, dry mouth and morning headaches,  Risk factors for OSA were present,  including : Body mass index  is 46.83 kg/m., neck size  17 and upper airway anatomy.  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  1) SPLIT night in lab -study , AHI split at 20/h .  If denied, will order HST.  Keeping inind that she may qualify for Zepbound, depending on degree of apnea.  2) Narcolepsy panel, always had been napping a lot, vivid dreams, and long before she gained weight.  3) Order for modafinil to stay awake at work , during  commute.    I plan to follow up personally within the next 3-5 .months .  A total time of  45  minutes consistent of a part of face to face encounter , exam and interview,  and additional preparation time for chart review was spent .  At today's visit, we discussed treatment options, associated risk and benefits, and engage in counseling as needed including, but not limited to:  Sleep hygiene, Quality Sleep Habits, and Safety concerns for patients with daytime sleepiness who are warned to not operate machinery/ motor vehicles when drowsy.  Additionally, the following were reviewed: Past medical records, past medical and surgical history, family and social background, as well as relevant laboratory results, imaging findings, and medical notes, where applicable.  This note was generated by myself in part by using dictation software, and as a result, it may contain unintentional typos and errors.  Nevertheless, effort was made to accurately convey the pertinent aspects of the patient's visit.   Dedra Gores, MD  11-07-2024  [facility]  Sleep Board certified in Sleep Medicine by [employer] of Sleep Medicine and Diplomate of the [employer] (AASM) . Board certified In Neurology, Diplomat of the ABPN,  Fellow of the [employer] of Neu[employer].

## 2024-11-07 NOTE — Patient Instructions (Signed)
  In short, Kellie [name] is presenting with excessive daytime sleepiness , high risk for OSA and OHV, and has poor nasal airway patency- explaining snoring, dry mouth and morning headaches,  Risk factors for OSA were present,  including : Body mass index is 46.83 kg/m., neck size  17 and upper airway anatomy.  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  1) SPLIT night in lab -study , AHI split at 20/h .  If denied, will order HST.  Keeping inind that she may qualify for Zepbound, depending on degree of apnea.  2) Narcolepsy panel, always had been napping a lot, vivid dreams, and long before she gained weight.  3) Order for modafinil to stay awake at work , during  commute.    I plan to follow up personally within the next 3-5 .months .  A total time of  45  minutes consistent of a part of face to face encounter , exam and interview,  and additional preparation time for chart review was spent .  At today's visit, we discussed treatment options, associated risk and benefits, and engage in counseling as needed including, but not limited to:  Sleep hygiene, Quality Sleep Habits, and Safety concerns for patients with daytime sleepiness who are warned to not operate machinery/ motor vehicles when drowsy.  TV screen can be replaced by a sound machine or audio books.   Additionally, the following were reviewed: Past medical records, past medical and surgical history, family and social background, as well as relevant laboratory results, imaging findings, and medical notes, where applicable.  This note  was generated by myself in part by using dictation software, and as a result, it may contain unintentional typos and errors.  Nevertheless, effort was made to accurately convey the pertinent aspects of the patient's visit.   Dedra Gores, MD  11-07-2024  [facility] and [facility] Sleep Board certified in Sleep Medicine by [employer] and Diplomate of the [employer] ([employer]) . Board certified In Neurology, Diplomat of the ABPN,  Fellow of the [employer].

## 2024-11-25 ENCOUNTER — Ambulatory Visit (HOSPITAL_COMMUNITY)
Admission: RE | Admit: 2024-11-25 | Discharge: 2024-11-25 | Disposition: A | Discharge status: Home / Self Care | Source: Ambulatory Visit | Attending: Family Medicine | Admitting: Family Medicine

## 2024-11-25 DIAGNOSIS — E215 Disorder of parathyroid gland, unspecified: Secondary | ICD-10-CM | POA: Insufficient documentation

## 2024-11-25 MED ORDER — TECHNETIUM TC 99M SESTAMIBI - CARDIOLITE
26.2000 | Freq: Once | INTRAVENOUS | Status: AC
  Administered 2024-11-25: 26.2 via INTRAVENOUS

## 2024-11-30 ENCOUNTER — Other Ambulatory Visit (HOSPITAL_BASED_OUTPATIENT_CLINIC_OR_DEPARTMENT_OTHER): Payer: Self-pay

## 2024-12-08 ENCOUNTER — Other Ambulatory Visit (HOSPITAL_BASED_OUTPATIENT_CLINIC_OR_DEPARTMENT_OTHER): Payer: Self-pay

## 2024-12-12 ENCOUNTER — Encounter: Payer: Self-pay | Admitting: Neurology

## 2024-12-13 ENCOUNTER — Ambulatory Visit (INDEPENDENT_AMBULATORY_CARE_PROVIDER_SITE_OTHER): Admitting: Family Medicine

## 2024-12-22 ENCOUNTER — Other Ambulatory Visit (HOSPITAL_BASED_OUTPATIENT_CLINIC_OR_DEPARTMENT_OTHER): Payer: Self-pay

## 2024-12-30 ENCOUNTER — Encounter (INDEPENDENT_AMBULATORY_CARE_PROVIDER_SITE_OTHER): Payer: Self-pay

## 2025-01-02 ENCOUNTER — Telehealth (INDEPENDENT_AMBULATORY_CARE_PROVIDER_SITE_OTHER): Admitting: Family Medicine

## 2025-01-02 ENCOUNTER — Encounter (INDEPENDENT_AMBULATORY_CARE_PROVIDER_SITE_OTHER): Payer: Self-pay | Admitting: Family Medicine

## 2025-01-02 ENCOUNTER — Encounter (INDEPENDENT_AMBULATORY_CARE_PROVIDER_SITE_OTHER): Payer: Self-pay

## 2025-01-31 ENCOUNTER — Ambulatory Visit (INDEPENDENT_AMBULATORY_CARE_PROVIDER_SITE_OTHER): Admitting: Family Medicine
